# Patient Record
Sex: Male | Born: 1935 | Race: White | Hispanic: No | Smoking: Never smoker
Health system: Southern US, Community
[De-identification: ages and names within clinical notes are randomized; demographics above are authoritative.]

## PROBLEM LIST (undated history)

## (undated) DIAGNOSIS — K5790 Diverticulosis of intestine, part unspecified, without perforation or abscess without bleeding: Secondary | ICD-10-CM

## (undated) DIAGNOSIS — C61 Malignant neoplasm of prostate: Secondary | ICD-10-CM

## (undated) DIAGNOSIS — M1990 Unspecified osteoarthritis, unspecified site: Secondary | ICD-10-CM

## (undated) DIAGNOSIS — R0602 Shortness of breath: Secondary | ICD-10-CM

## (undated) DIAGNOSIS — E785 Hyperlipidemia, unspecified: Secondary | ICD-10-CM

## (undated) DIAGNOSIS — Z87440 Personal history of urinary (tract) infections: Secondary | ICD-10-CM

## (undated) HISTORY — DX: Unspecified osteoarthritis, unspecified site: M19.90

## (undated) HISTORY — PX: NECK SURGERY: SHX720

## (undated) HISTORY — PX: TONSILLECTOMY: SUR1361

## (undated) HISTORY — DX: Malignant neoplasm of prostate: C61

## (undated) HISTORY — PX: APPENDECTOMY: SHX54

## (undated) HISTORY — DX: Hyperlipidemia, unspecified: E78.5

## (undated) HISTORY — DX: Personal history of urinary (tract) infections: Z87.440

## (undated) HISTORY — DX: Diverticulosis of intestine, part unspecified, without perforation or abscess without bleeding: K57.90

## (undated) HISTORY — PX: PROSTATE SURGERY: SHX751

---

## 1997-09-10 ENCOUNTER — Ambulatory Visit (HOSPITAL_COMMUNITY): Admission: RE | Admit: 1997-09-10 | Discharge: 1997-09-10 | Payer: Self-pay | Admitting: Urology

## 2000-05-06 ENCOUNTER — Ambulatory Visit (HOSPITAL_COMMUNITY): Admission: EM | Admit: 2000-05-06 | Discharge: 2000-05-06 | Payer: Self-pay | Admitting: *Deleted

## 2008-05-01 ENCOUNTER — Ambulatory Visit: Payer: Self-pay | Admitting: Oncology

## 2008-05-30 LAB — COMPREHENSIVE METABOLIC PANEL
Albumin: 4.1 g/dL (ref 3.5–5.2)
Alkaline Phosphatase: 56 U/L (ref 39–117)
BUN: 26 mg/dL — ABNORMAL HIGH (ref 6–23)
CO2: 25 mEq/L (ref 19–32)
Calcium: 9 mg/dL (ref 8.4–10.5)
Glucose, Bld: 138 mg/dL — ABNORMAL HIGH (ref 70–99)
Potassium: 3.8 mEq/L (ref 3.5–5.3)

## 2008-05-30 LAB — TESTOSTERONE: Testosterone: 234.56 ng/dL — ABNORMAL LOW (ref 350–890)

## 2008-05-30 LAB — CBC WITH DIFFERENTIAL/PLATELET
Basophils Absolute: 0 10*3/uL (ref 0.0–0.1)
Eosinophils Absolute: 0.1 10*3/uL (ref 0.0–0.5)
HGB: 12.4 g/dL — ABNORMAL LOW (ref 13.0–17.1)
MCV: 90.9 fL (ref 79.3–98.0)
MONO%: 10.3 % (ref 0.0–14.0)
NEUT#: 3.2 10*3/uL (ref 1.5–6.5)
Platelets: 177 10*3/uL (ref 140–400)
RDW: 13.2 % (ref 11.0–14.6)

## 2008-05-30 LAB — PSA: PSA: 24.31 ng/mL — ABNORMAL HIGH (ref 0.10–4.00)

## 2008-06-04 ENCOUNTER — Ambulatory Visit: Admission: RE | Admit: 2008-06-04 | Discharge: 2008-08-21 | Payer: Self-pay | Admitting: Radiation Oncology

## 2008-07-05 ENCOUNTER — Ambulatory Visit (HOSPITAL_COMMUNITY): Admission: RE | Admit: 2008-07-05 | Discharge: 2008-07-05 | Payer: Self-pay | Admitting: Oncology

## 2008-07-10 ENCOUNTER — Ambulatory Visit: Payer: Self-pay | Admitting: Oncology

## 2008-07-10 LAB — COMPREHENSIVE METABOLIC PANEL
ALT: 19 U/L (ref 0–53)
Albumin: 3.8 g/dL (ref 3.5–5.2)
Alkaline Phosphatase: 54 U/L (ref 39–117)
CO2: 29 mEq/L (ref 19–32)
Glucose, Bld: 181 mg/dL — ABNORMAL HIGH (ref 70–99)
Potassium: 4.3 mEq/L (ref 3.5–5.3)
Sodium: 141 mEq/L (ref 135–145)
Total Bilirubin: 0.8 mg/dL (ref 0.3–1.2)
Total Protein: 7.4 g/dL (ref 6.0–8.3)

## 2008-07-10 LAB — CBC WITH DIFFERENTIAL/PLATELET
BASO%: 0.4 % (ref 0.0–2.0)
Eosinophils Absolute: 0.2 10*3/uL (ref 0.0–0.5)
LYMPH%: 19.3 % (ref 14.0–49.0)
MCHC: 34.8 g/dL (ref 32.0–36.0)
MONO#: 0.4 10*3/uL (ref 0.1–0.9)
MONO%: 8.5 % (ref 0.0–14.0)
NEUT#: 3.4 10*3/uL (ref 1.5–6.5)
RBC: 3.8 10*6/uL — ABNORMAL LOW (ref 4.20–5.82)
RDW: 13.6 % (ref 11.0–14.6)
WBC: 5.1 10*3/uL (ref 4.0–10.3)

## 2008-07-10 LAB — PSA: PSA: 17.41 ng/mL — ABNORMAL HIGH (ref 0.10–4.00)

## 2008-08-31 ENCOUNTER — Ambulatory Visit: Payer: Self-pay | Admitting: Oncology

## 2008-09-27 ENCOUNTER — Encounter (INDEPENDENT_AMBULATORY_CARE_PROVIDER_SITE_OTHER): Payer: Self-pay | Admitting: Urology

## 2008-09-27 ENCOUNTER — Ambulatory Visit (HOSPITAL_BASED_OUTPATIENT_CLINIC_OR_DEPARTMENT_OTHER): Admission: RE | Admit: 2008-09-27 | Discharge: 2008-09-28 | Payer: Self-pay | Admitting: Urology

## 2008-10-10 ENCOUNTER — Ambulatory Visit: Payer: Self-pay | Admitting: Oncology

## 2008-10-12 LAB — COMPREHENSIVE METABOLIC PANEL
Albumin: 3.9 g/dL (ref 3.5–5.2)
BUN: 27 mg/dL — ABNORMAL HIGH (ref 6–23)
CO2: 22 mEq/L (ref 19–32)
Calcium: 9.2 mg/dL (ref 8.4–10.5)
Chloride: 106 mEq/L (ref 96–112)
Glucose, Bld: 207 mg/dL — ABNORMAL HIGH (ref 70–99)
Potassium: 4.7 mEq/L (ref 3.5–5.3)
Sodium: 138 mEq/L (ref 135–145)
Total Protein: 7.4 g/dL (ref 6.0–8.3)

## 2008-10-12 LAB — CBC WITH DIFFERENTIAL/PLATELET
Basophils Absolute: 0 10*3/uL (ref 0.0–0.1)
Eosinophils Absolute: 0.3 10*3/uL (ref 0.0–0.5)
HGB: 10.9 g/dL — ABNORMAL LOW (ref 13.0–17.1)
MCV: 90.1 fL (ref 79.3–98.0)
MONO#: 0.5 10*3/uL (ref 0.1–0.9)
NEUT#: 5.2 10*3/uL (ref 1.5–6.5)
RBC: 3.48 10*6/uL — ABNORMAL LOW (ref 4.20–5.82)
RDW: 13.6 % (ref 11.0–14.6)
WBC: 7 10*3/uL (ref 4.0–10.3)
lymph#: 0.9 10*3/uL (ref 0.9–3.3)

## 2008-11-27 ENCOUNTER — Ambulatory Visit: Admission: RE | Admit: 2008-11-27 | Discharge: 2009-02-14 | Payer: Self-pay | Admitting: Radiation Oncology

## 2009-01-30 LAB — URINALYSIS, MICROSCOPIC - CHCC: Bilirubin (Urine): NEGATIVE

## 2009-03-01 ENCOUNTER — Ambulatory Visit: Admission: RE | Admit: 2009-03-01 | Discharge: 2009-03-01 | Payer: Self-pay | Admitting: Radiation Oncology

## 2009-03-01 LAB — URINALYSIS, MICROSCOPIC - CHCC
Bilirubin (Urine): NEGATIVE
Glucose: 1 g/dL
Ketones: NEGATIVE mg/dL
Specific Gravity, Urine: 1.025 (ref 1.003–1.035)

## 2009-03-19 ENCOUNTER — Ambulatory Visit: Admission: RE | Admit: 2009-03-19 | Discharge: 2009-03-19 | Payer: Self-pay | Admitting: Radiation Oncology

## 2009-03-19 LAB — URINALYSIS, MICROSCOPIC - CHCC
Bilirubin (Urine): NEGATIVE
Specific Gravity, Urine: 1.025 (ref 1.003–1.035)

## 2009-09-05 ENCOUNTER — Ambulatory Visit: Payer: Self-pay | Admitting: Oncology

## 2010-04-06 ENCOUNTER — Encounter: Payer: Self-pay | Admitting: Oncology

## 2010-04-07 ENCOUNTER — Encounter: Payer: Self-pay | Admitting: Oncology

## 2010-06-22 LAB — CBC
MCHC: 33.7 g/dL (ref 30.0–36.0)
MCV: 90.8 fL (ref 78.0–100.0)
Platelets: 181 10*3/uL (ref 150–400)
RDW: 13.1 % (ref 11.5–15.5)
WBC: 5.5 10*3/uL (ref 4.0–10.5)

## 2010-06-22 LAB — GLUCOSE, CAPILLARY
Glucose-Capillary: 160 mg/dL — ABNORMAL HIGH (ref 70–99)
Glucose-Capillary: 269 mg/dL — ABNORMAL HIGH (ref 70–99)

## 2010-06-22 LAB — BASIC METABOLIC PANEL
BUN: 25 mg/dL — ABNORMAL HIGH (ref 6–23)
Chloride: 104 mEq/L (ref 96–112)
Creatinine, Ser: 1.75 mg/dL — ABNORMAL HIGH (ref 0.4–1.5)
Glucose, Bld: 151 mg/dL — ABNORMAL HIGH (ref 70–99)

## 2010-07-29 NOTE — Op Note (Signed)
[name], Ian Patterson             ACCOUNT NO.:  [id number]   MEDICAL RECORD NO.:  [id number]          PATIENT TYPE:  AMB   LOCATION:  [facility]                         FACILITY:  [facility]   PHYSICIAN:  Heloise Purpura, MD      DATE OF BIRTH:  Mar 03, 1924   DATE OF PROCEDURE:  09/27/2008  DATE OF DISCHARGE:                               OPERATIVE REPORT   PREOPERATIVE DIAGNOSES:  1. Prostate cancer.  2. Urinary retention.   POSTOPERATIVE DIAGNOSES:  1. Prostate cancer.  2. Urinary retention.   PROCEDURE:  1. Cystoscopy.  2. Transurethral resection of the prostate.   SURGEON:  Heloise Purpura, M.D.   ASSISTANT:  Edward Qualia, M.D.   ANESTHESIA:  General.   COMPLICATIONS:  None.   ESTIMATED BLOOD LOSS:  Minimal.   SPECIMENS:  Prostate chips.   DISPOSITION:  Specimen to pathology.   INDICATIONS:  Mr. Law is an 77 year old gentleman who was recently  diagnosed with high risk clinically localized prostate cancer.  He has  been treated with androgen deprivation and is scheduled to undergo  treatment with external beam radiation therapy.  However, he was noted  to be in urinary retention with bilateral hydroureteronephrosis and  necessitated placement of a Foley catheter.  His hydronephrosis resolved  following placement of his catheter, and he did undergo multiple voiding  trials which were unsuccessful despite maximum alpha blockade.  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  The potential risks, complications, and alternative  treatment options were discussed in detail, and informed consent was  obtained.   DESCRIPTION OF PROCEDURE:  The patient was taken to the operating room,  and a general anesthetic was administered.  He was given preoperative  antibiotics, placed in the dorsal lithotomy position, and prepped and  draped in the usual sterile fashion.  Next, a  preoperative time out was  performed.  Of note, he was administered culture-specific antibiotics.  Cystourethroscopy was then performed which demonstrated a normal  anterior urethra.  Prostatic urethra was notable for very large lateral  lobes along with a medium lobe.  Inspection of the bladder revealed  moderate trabeculation without evidence for bladder tumors or stones.  The left ureteral orifice was easily identified.  The right ureteral  orifice was never completely visualized, but the prostate was noted to  be well away from the trigone of the bladder.  The 28-French  resectoscope sheath was then placed into the bladder, and utilizing the  bipolar resection loop, the median lobe was then resected from the  bladder neck back to the verumontanum.  The lateral lobes were then  systematically resected down to the prostatic capsule from the bladder  neck back to the level of the verumontanum.  Prostate chips were then  removed from the bladder.  Reexamination revealed no further prostate  chips within the bladder.  Hemostasis was achieved, and the scope was  removed.  Then, a 22-French 3-way Foley catheter was placed.  The  patient was placed under continuous bladder irrigation.  He  tolerated  the procedure well and without complications.      Heloise Purpura, MD  Electronically Signed     LB/MEDQ  D:  09/27/2008  T:  09/27/2008  Job:  [id number]

## 2010-10-06 ENCOUNTER — Emergency Department (HOSPITAL_COMMUNITY)
Admission: EM | Admit: 2010-10-06 | Discharge: 2010-10-06 | Disposition: A | Discharge status: Home / Self Care | Payer: Medicare Other | Attending: Emergency Medicine | Admitting: Emergency Medicine

## 2010-10-06 ENCOUNTER — Emergency Department (HOSPITAL_COMMUNITY): Payer: Medicare Other

## 2010-10-06 ENCOUNTER — Encounter (HOSPITAL_COMMUNITY): Payer: Self-pay | Admitting: Radiology

## 2010-10-06 DIAGNOSIS — R079 Chest pain, unspecified: Secondary | ICD-10-CM | POA: Insufficient documentation

## 2010-10-06 DIAGNOSIS — Z79899 Other long term (current) drug therapy: Secondary | ICD-10-CM | POA: Insufficient documentation

## 2010-10-06 DIAGNOSIS — J309 Allergic rhinitis, unspecified: Secondary | ICD-10-CM | POA: Insufficient documentation

## 2010-10-06 DIAGNOSIS — E119 Type 2 diabetes mellitus without complications: Secondary | ICD-10-CM | POA: Insufficient documentation

## 2010-10-06 DIAGNOSIS — R0609 Other forms of dyspnea: Secondary | ICD-10-CM | POA: Insufficient documentation

## 2010-10-06 DIAGNOSIS — E785 Hyperlipidemia, unspecified: Secondary | ICD-10-CM | POA: Insufficient documentation

## 2010-10-06 DIAGNOSIS — R0602 Shortness of breath: Secondary | ICD-10-CM | POA: Insufficient documentation

## 2010-10-06 DIAGNOSIS — R0989 Other specified symptoms and signs involving the circulatory and respiratory systems: Secondary | ICD-10-CM | POA: Insufficient documentation

## 2010-10-06 HISTORY — DX: Shortness of breath: R06.02

## 2010-10-06 LAB — DIFFERENTIAL
Eosinophils Relative: 2 % (ref 0–5)
Lymphocytes Relative: 13 % (ref 12–46)
Lymphs Abs: 0.8 10*3/uL (ref 0.7–4.0)
Monocytes Absolute: 0.6 10*3/uL (ref 0.1–1.0)
Monocytes Relative: 10 % (ref 3–12)

## 2010-10-06 LAB — BASIC METABOLIC PANEL
BUN: 19 mg/dL (ref 6–23)
CO2: 25 mEq/L (ref 19–32)
Calcium: 9.4 mg/dL (ref 8.4–10.5)
GFR calc non Af Amer: 45 mL/min — ABNORMAL LOW (ref >60)
Glucose, Bld: 180 mg/dL — ABNORMAL HIGH (ref 70–99)

## 2010-10-06 LAB — CBC
HCT: 34.1 % — ABNORMAL LOW (ref 39.0–52.0)
Hemoglobin: 11.5 g/dL — ABNORMAL LOW (ref 13.0–17.0)
MCH: 29.9 pg (ref 26.0–34.0)
MCHC: 33.7 g/dL (ref 30.0–36.0)
MCV: 88.8 fL (ref 78.0–100.0)
RDW: 13.4 % (ref 11.5–15.5)

## 2010-10-06 MED ORDER — TECHNETIUM TO 99M ALBUMIN AGGREGATED
4.7000 | Freq: Once | INTRAVENOUS | Status: AC | PRN
  Administered 2010-10-06: 4.7 via INTRAVENOUS

## 2010-10-06 MED ORDER — XENON XE 133 GAS
10.4000 | GAS_FOR_INHALATION | Freq: Once | RESPIRATORY_TRACT | Status: AC | PRN
  Administered 2010-10-06: 10.4 via RESPIRATORY_TRACT

## 2010-10-09 ENCOUNTER — Emergency Department (HOSPITAL_COMMUNITY)
Admission: EM | Admit: 2010-10-09 | Discharge: 2010-10-09 | Disposition: A | Discharge status: Home / Self Care | Payer: Medicare Other | Attending: Emergency Medicine | Admitting: Emergency Medicine

## 2010-10-09 ENCOUNTER — Emergency Department (HOSPITAL_COMMUNITY): Payer: Medicare Other

## 2010-10-09 DIAGNOSIS — R1310 Dysphagia, unspecified: Secondary | ICD-10-CM | POA: Insufficient documentation

## 2010-10-09 DIAGNOSIS — E119 Type 2 diabetes mellitus without complications: Secondary | ICD-10-CM | POA: Insufficient documentation

## 2010-10-09 DIAGNOSIS — R059 Cough, unspecified: Secondary | ICD-10-CM | POA: Insufficient documentation

## 2010-10-09 DIAGNOSIS — E785 Hyperlipidemia, unspecified: Secondary | ICD-10-CM | POA: Insufficient documentation

## 2010-10-09 DIAGNOSIS — R05 Cough: Secondary | ICD-10-CM | POA: Insufficient documentation

## 2010-10-09 LAB — CBC
HCT: 37 % — ABNORMAL LOW (ref 39.0–52.0)
Hemoglobin: 12.4 g/dL — ABNORMAL LOW (ref 13.0–17.0)
MCHC: 33.5 g/dL (ref 30.0–36.0)

## 2010-10-09 LAB — DIFFERENTIAL
Eosinophils Absolute: 0.1 10*3/uL (ref 0.0–0.7)
Eosinophils Relative: 2 % (ref 0–5)
Lymphocytes Relative: 9 % — ABNORMAL LOW (ref 12–46)
Lymphs Abs: 0.5 10*3/uL — ABNORMAL LOW (ref 0.7–4.0)
Monocytes Relative: 9 % (ref 3–12)
Neutrophils Relative %: 80 % — ABNORMAL HIGH (ref 43–77)

## 2010-10-09 LAB — POCT I-STAT, CHEM 8
BUN: 25 mg/dL — ABNORMAL HIGH (ref 6–23)
Chloride: 103 mEq/L (ref 96–112)
Creatinine, Ser: 1.6 mg/dL — ABNORMAL HIGH (ref 0.50–1.35)
Glucose, Bld: 186 mg/dL — ABNORMAL HIGH (ref 70–99)
Hemoglobin: 13.3 g/dL (ref 13.0–17.0)
Potassium: 4 mEq/L (ref 3.5–5.1)

## 2010-10-12 ENCOUNTER — Emergency Department (HOSPITAL_COMMUNITY)
Admission: EM | Admit: 2010-10-12 | Discharge: 2010-10-12 | Disposition: A | Discharge status: Home / Self Care | Payer: Medicare Other | Attending: Emergency Medicine | Admitting: Emergency Medicine

## 2010-10-12 DIAGNOSIS — E119 Type 2 diabetes mellitus without complications: Secondary | ICD-10-CM | POA: Insufficient documentation

## 2010-10-12 DIAGNOSIS — E785 Hyperlipidemia, unspecified: Secondary | ICD-10-CM | POA: Insufficient documentation

## 2010-10-12 DIAGNOSIS — R059 Cough, unspecified: Secondary | ICD-10-CM | POA: Insufficient documentation

## 2010-10-12 DIAGNOSIS — R05 Cough: Secondary | ICD-10-CM | POA: Insufficient documentation

## 2010-10-12 DIAGNOSIS — R1310 Dysphagia, unspecified: Secondary | ICD-10-CM | POA: Insufficient documentation

## 2010-10-12 DIAGNOSIS — J3489 Other specified disorders of nose and nasal sinuses: Secondary | ICD-10-CM | POA: Insufficient documentation

## 2010-10-22 ENCOUNTER — Encounter: Payer: Self-pay | Admitting: Gastroenterology

## 2010-10-22 ENCOUNTER — Ambulatory Visit (INDEPENDENT_AMBULATORY_CARE_PROVIDER_SITE_OTHER): Payer: Medicare Other | Admitting: Gastroenterology

## 2010-10-22 DIAGNOSIS — R05 Cough: Secondary | ICD-10-CM

## 2010-10-22 DIAGNOSIS — C61 Malignant neoplasm of prostate: Secondary | ICD-10-CM

## 2010-10-22 DIAGNOSIS — R7309 Other abnormal glucose: Secondary | ICD-10-CM

## 2010-10-22 DIAGNOSIS — K639 Disease of intestine, unspecified: Secondary | ICD-10-CM

## 2010-10-22 DIAGNOSIS — K921 Melena: Secondary | ICD-10-CM

## 2010-10-22 DIAGNOSIS — R1310 Dysphagia, unspecified: Secondary | ICD-10-CM | POA: Insufficient documentation

## 2010-10-22 DIAGNOSIS — K929 Disease of digestive system, unspecified: Secondary | ICD-10-CM

## 2010-10-22 DIAGNOSIS — K219 Gastro-esophageal reflux disease without esophagitis: Secondary | ICD-10-CM

## 2010-10-22 DIAGNOSIS — Y842 Radiological procedure and radiotherapy as the cause of abnormal reaction of the patient, or of later complication, without mention of misadventure at the time of the procedure: Secondary | ICD-10-CM

## 2010-10-22 DIAGNOSIS — R7303 Prediabetes: Secondary | ICD-10-CM | POA: Insufficient documentation

## 2010-10-22 MED ORDER — RABEPRAZOLE SODIUM 20 MG PO TBEC: 20.0000 mg | DELAYED_RELEASE_TABLET | Freq: Every day | ORAL | Status: DC

## 2010-10-22 NOTE — Progress Notes (Addendum)
Extremely pleasant 77 year old Caucasian male who receives his health care from the [facility] in [place]. He has what appears to be asthmatic bronchitis perhaps related to chronic GERD associated shortness of breath, dyspnea, hoarseness, coughing, and some dysphagia in the upper pharyngeal area mostly for thick liquids. Recently had a barium swallow which was relatively unremarkable. The patient relates that he had endoscopy and esophageal dilatation 10 years ago with good improvement. His acid reflux is fairly well controlled in terms of regurgitation and burning substernal chest pain on daily Prilosec and when necessary ranitidine.. He apparently has voluntarily lost 20 pounds of weight over the last year. Other problems have been recurrent disc problems in his neck with a cervical laminectomy some 30 years ago. Also has received radiation seed implants for prostate cancer, completed one year ago.  He denies abuse of alcohol or cigarettes or solid food dysphagia. He's recently been in the emergency room and has been treated with nasal inhalers and pulmonary inhalers for suspected allergy related bronchospasm and nasal drainage.Re View of his barium swallow showed tertiary contractions in the esophagus without any evidence of achalasia, and some esophageal reflux, but the barium pill passed easily. Recent EKG and chest x-ray are normal. The patient does describe periodic rectal bleeding, apparently had a negative colonoscopy 4 years ago. There is a vague history of possible adult onset diabetes. His medications are incomplete since he did not bring a list of these with him. He does take Prilosec, Flonase, Fish Oil, Glyburide, ranitidine, simvastatin Tussionex, and when necessary tramadol. He denies abuse of NSAIDs for a known history of peptic ulcer disease, gallbladder disease, pancreatitis or hepatitis. He has had previous appendectomy.  Review of systems: He complains of allergies, degenerative  arthritis, painless hematuria, chronic cough with thick sputum production, night sweats, shortness of breath with exertion, and recurrent sore throat. He has been on frequent antibiotics for prostatitis within the last 2 months. He is followed by urology.   The rest of his 10 system review of systems was negative.   Past Medical History  Diagnosis Date  . SOB (shortness of breath)   . Prostate cancer     hx of   . Arthritis   . Asthma   . Diabetes mellitus   . Diverticulosis   . Hyperlipemia   . Hx: UTI (urinary tract infection)    Past Surgical History  Procedure Date  . Appendectomy   . Tonsillectomy   . Prostate surgery   . Neck surgery     reports that he has never smoked. He has never used smokeless tobacco. He reports that he drinks alcohol. He reports that he does not use illicit drugs. family history is negative for Colon cancer. Allergies  Allergen Reactions  . Penicillins    Physical Exam: Awake and alert no acute distress appearing his stated age. I cannot appreciate stigmata of chronic liver disease, thyromegaly or lymphadenopathy. Examination of the neck shows no other abnormalities. Inspection of the oral pharyngeal area is unremarkable. Chest is clear without wheezes or rhonchi, and cardiac exam is unremarkable with a normal rhythm. There is no hepatosplenomegaly, abdominal masses or tenderness. Bowel sounds are normal. Exam is deferred. Peripheral extremities show trace edema but otherwise no evidence of swollen joints, edema, or phlebitis. Mental status is clear there are no focal neurological deficits.  Had the patient swallow water and there was no aspiration, choking, or other problems with this maneuver.  Assessment and plan: I suspect this patient has rather  severe acid reflux with associated extra esophageal manifestations of acid regurgitation. We of course did use quit esophageal carcinoma despite a negative barium swallow. I scheduled him for endoscopic exam  and possible dilation. If this is unremarkable we'll proceed with esophageal manometry. I placed him on standard antireflux maneuvers with AcipHex 20 mg twice a day. His symptomatology is consistent with mild radiation proctitis, and apparently he has had a recent colonoscopy which was unremarkable, and he is not interested in repeating this procedure.

## 2010-10-22 NOTE — Patient Instructions (Addendum)
You should bring a complete list of all of your medications with name, dose and instructions every visit.  Your procedure has been scheduled for 10/27/2010, please follow the seperate instructions.  Stop the prilosec and start the Aciphex 20mg  once a day about 30 min before breakfast. Samples given.

## 2010-10-27 ENCOUNTER — Encounter: Payer: Self-pay | Admitting: Gastroenterology

## 2010-10-27 ENCOUNTER — Ambulatory Visit (AMBULATORY_SURGERY_CENTER): Payer: Medicare Other | Admitting: Gastroenterology

## 2010-10-27 DIAGNOSIS — R1310 Dysphagia, unspecified: Secondary | ICD-10-CM

## 2010-10-27 DIAGNOSIS — R059 Cough, unspecified: Secondary | ICD-10-CM

## 2010-10-27 DIAGNOSIS — R05 Cough: Secondary | ICD-10-CM

## 2010-10-27 DIAGNOSIS — K219 Gastro-esophageal reflux disease without esophagitis: Secondary | ICD-10-CM

## 2010-10-27 DIAGNOSIS — K2270 Barrett's esophagus without dysplasia: Secondary | ICD-10-CM

## 2010-10-27 LAB — GLUCOSE, CAPILLARY

## 2010-10-27 MED ORDER — SODIUM CHLORIDE 0.9 % IV SOLN: 500.0000 mL | INTRAVENOUS | Status: AC

## 2010-10-27 NOTE — Progress Notes (Signed)
Patient states on Saturday, 10/25/10 he was unable to bear weight on right knee. Presently able to bear weight today with assistancehe of a walker. Patient stating he thinks he has Lyme Disease. Patient complains of inability to swallow water, yet able to swallow solid foods.

## 2010-10-27 NOTE — Patient Instructions (Addendum)
Please refer to blue and green discharge instruction sheet. Clear liquids for the first hour after you leave today, then soft foods the rest of the day.

## 2010-10-28 ENCOUNTER — Telehealth: Payer: Self-pay

## 2010-10-28 NOTE — Telephone Encounter (Signed)
No ID on answering machine. 

## 2010-10-29 ENCOUNTER — Telehealth: Payer: Self-pay | Admitting: Gastroenterology

## 2010-10-29 DIAGNOSIS — T17320A Food in larynx causing asphyxiation, initial encounter: Secondary | ICD-10-CM

## 2010-10-29 DIAGNOSIS — T17310A Gastric contents in larynx causing asphyxiation, initial encounter: Secondary | ICD-10-CM

## 2010-10-29 DIAGNOSIS — J9801 Acute bronchospasm: Secondary | ICD-10-CM

## 2010-10-29 NOTE — Telephone Encounter (Signed)
Pt reports he's still having trouble swallowing and he can't catch his breath. Pt had EGD with Dilation on 10/27/10. He thought the EGD would help, but it's the same as it was. He reports he has trouble swallowing his saliva and water, apparently he does ok on solid food. He reports his throat is sore, he's hoarse and he's coughing. Instructed pt to go to the ER or call 911 if he can't breathe or swallow; I will speak with Dr Jarold Motto in am and call him.                           (Pt states he has a dental appt at 2pm tomorrow) Please advise.Marland Kitchen

## 2010-10-30 ENCOUNTER — Encounter: Payer: Self-pay | Admitting: Internal Medicine

## 2010-10-30 NOTE — Telephone Encounter (Signed)
Notified pt his CT scan is 11/03/10 at [address] at 2:30pm. Pt stated understanding.

## 2010-10-30 NOTE — Telephone Encounter (Signed)
He needsa i care w/u////I suspect he has CV etiology of his problems and will need chest CT scan

## 2010-10-30 NOTE — Telephone Encounter (Addendum)
Pt reports he made it through the night, but his throat is still a little sore. Pt stated he is going to try to keep his dental appt today. Informed pt he needs to f/u with his PCP and he needs a chest CT for the ongoing problem. Pt reports  His PCP is at the [facility] in [place] and he doesn't know how long it will take to get a scan. DR Jarold Motto agreed to order the CT scan at [facility] for Monday, 11/03/10 at 2:30pm.

## 2010-11-03 ENCOUNTER — Ambulatory Visit (INDEPENDENT_AMBULATORY_CARE_PROVIDER_SITE_OTHER)
Admission: RE | Admit: 2010-11-03 | Discharge: 2010-11-03 | Disposition: A | Discharge status: Home / Self Care | Payer: Medicare Other | Source: Ambulatory Visit | Attending: Gastroenterology | Admitting: Gastroenterology

## 2010-11-03 DIAGNOSIS — T17308A Unspecified foreign body in larynx causing other injury, initial encounter: Secondary | ICD-10-CM

## 2010-11-03 DIAGNOSIS — T17320A Food in larynx causing asphyxiation, initial encounter: Secondary | ICD-10-CM

## 2010-11-03 DIAGNOSIS — T17310A Gastric contents in larynx causing asphyxiation, initial encounter: Secondary | ICD-10-CM

## 2010-11-03 DIAGNOSIS — R1310 Dysphagia, unspecified: Secondary | ICD-10-CM

## 2010-11-03 DIAGNOSIS — J9801 Acute bronchospasm: Secondary | ICD-10-CM

## 2010-11-04 ENCOUNTER — Telehealth: Payer: Self-pay | Admitting: *Deleted

## 2010-11-04 MED ORDER — RABEPRAZOLE SODIUM 20 MG PO TBEC: 20.0000 mg | DELAYED_RELEASE_TABLET | Freq: Two times a day (BID) | ORAL | Status: DC

## 2010-11-04 NOTE — Telephone Encounter (Signed)
Message copied by Florene Glen on Tue Nov 04, 2010  9:40 AM ------      Message from: Jarold Motto, DAVID R      Created: Tue Nov 04, 2010  9:22 AM       Continue bid PPI Rx... CT scan of the chest is unremarkable. I would recommend to him that he schedule followup at the [facility] for his ongoing care.

## 2010-11-04 NOTE — Telephone Encounter (Signed)
Notified pt his CT scan is unremarkable and to continue Aciphex BID per Dr Jarold Motto. He is advised to f/u at the [facility]. Pt to pick up a script for the Aciphex.

## 2010-11-05 ENCOUNTER — Encounter: Payer: Self-pay | Admitting: Gastroenterology

## 2010-12-06 ENCOUNTER — Emergency Department (HOSPITAL_COMMUNITY)
Admission: EM | Admit: 2010-12-06 | Discharge: 2010-12-06 | Disposition: A | Discharge status: Home / Self Care | Payer: Medicare Other | Attending: Emergency Medicine | Admitting: Emergency Medicine

## 2010-12-06 DIAGNOSIS — R5381 Other malaise: Secondary | ICD-10-CM | POA: Insufficient documentation

## 2010-12-06 DIAGNOSIS — E119 Type 2 diabetes mellitus without complications: Secondary | ICD-10-CM | POA: Insufficient documentation

## 2010-12-06 DIAGNOSIS — I10 Essential (primary) hypertension: Secondary | ICD-10-CM | POA: Insufficient documentation

## 2010-12-06 DIAGNOSIS — R1310 Dysphagia, unspecified: Secondary | ICD-10-CM | POA: Insufficient documentation

## 2010-12-08 LAB — GLUCOSE, CAPILLARY: Glucose-Capillary: 110 mg/dL — ABNORMAL HIGH (ref 70–99)

## 2011-10-22 ENCOUNTER — Emergency Department (HOSPITAL_COMMUNITY)
Admission: EM | Admit: 2011-10-22 | Discharge: 2011-10-22 | Disposition: A | Discharge status: Home / Self Care | Payer: Medicare Other | Attending: Emergency Medicine | Admitting: Emergency Medicine

## 2011-10-22 ENCOUNTER — Encounter (HOSPITAL_COMMUNITY): Payer: Self-pay | Admitting: Emergency Medicine

## 2011-10-22 DIAGNOSIS — Z87440 Personal history of urinary (tract) infections: Secondary | ICD-10-CM | POA: Insufficient documentation

## 2011-10-22 DIAGNOSIS — Z7982 Long term (current) use of aspirin: Secondary | ICD-10-CM | POA: Insufficient documentation

## 2011-10-22 DIAGNOSIS — E119 Type 2 diabetes mellitus without complications: Secondary | ICD-10-CM | POA: Insufficient documentation

## 2011-10-22 DIAGNOSIS — E785 Hyperlipidemia, unspecified: Secondary | ICD-10-CM | POA: Insufficient documentation

## 2011-10-22 DIAGNOSIS — N390 Urinary tract infection, site not specified: Secondary | ICD-10-CM

## 2011-10-22 DIAGNOSIS — R339 Retention of urine, unspecified: Secondary | ICD-10-CM | POA: Insufficient documentation

## 2011-10-22 DIAGNOSIS — Z79899 Other long term (current) drug therapy: Secondary | ICD-10-CM | POA: Insufficient documentation

## 2011-10-22 DIAGNOSIS — H409 Unspecified glaucoma: Secondary | ICD-10-CM | POA: Insufficient documentation

## 2011-10-22 DIAGNOSIS — Z8546 Personal history of malignant neoplasm of prostate: Secondary | ICD-10-CM | POA: Insufficient documentation

## 2011-10-22 DIAGNOSIS — Z880 Allergy status to penicillin: Secondary | ICD-10-CM | POA: Insufficient documentation

## 2011-10-22 DIAGNOSIS — J45909 Unspecified asthma, uncomplicated: Secondary | ICD-10-CM | POA: Insufficient documentation

## 2011-10-22 LAB — URINALYSIS, ROUTINE W REFLEX MICROSCOPIC
Bilirubin Urine: NEGATIVE
Ketones, ur: NEGATIVE mg/dL
Nitrite: POSITIVE — AB
Protein, ur: NEGATIVE mg/dL
Urobilinogen, UA: 0.2 mg/dL (ref 0.0–1.0)

## 2011-10-22 LAB — URINE MICROSCOPIC-ADD ON

## 2011-10-22 MED ORDER — CIPROFLOXACIN HCL 500 MG PO TABS: 500.0000 mg | ORAL_TABLET | Freq: Two times a day (BID) | ORAL | Status: DC

## 2011-10-22 MED ORDER — SULFAMETHOXAZOLE-TRIMETHOPRIM 800-160 MG PO TABS: 1.0000 | ORAL_TABLET | Freq: Two times a day (BID) | ORAL | Status: DC

## 2011-10-22 NOTE — ED Notes (Signed)
Pt alert, arrives from home, c/o urinary retention, onset a few days ago, states increased frequency, burning with urination, this evening began retention, resp even unlabored, skin pwd

## 2011-10-22 NOTE — ED Provider Notes (Signed)
History     CSN: [id number]  Arrival date & time 10/22/11  8295   First MD Initiated Contact with Patient 10/22/11 [phone]      Chief Complaint  Patient presents with  . Urinary Retention    (Consider location/radiation/quality/duration/timing/severity/associated sxs/prior treatment) HPI Comments: Patient presents with an inability to void.  Was recently seen by the [facility] urologist in [place] and was told he had a stricture.  He denies fevers or chills.  No n/v/d.  Worse with attempting to urinate.  No alleviating factors.  The history is provided by the patient.    Past Medical History  Diagnosis Date  . SOB (shortness of breath)   . Prostate cancer     hx of   . Arthritis   . Asthma   . Diabetes mellitus   . Diverticulosis   . Hyperlipemia   . Hx: UTI (urinary tract infection)   . Glaucoma     Past Surgical History  Procedure Date  . Appendectomy   . Tonsillectomy   . Prostate surgery   . Neck surgery     Family History  Problem Relation Age of Onset  . Colon cancer Neg Hx     History  Substance Use Topics  . Smoking status: Never Smoker   . Smokeless tobacco: Never Used  . Alcohol Use: Yes     one daily       Review of Systems  All other systems reviewed and are negative.    Allergies  Penicillins  Home Medications   Current Outpatient Rx  Name Route Sig Dispense Refill  . ASPIRIN 325 MG PO TABS Oral Take 325 mg by mouth daily.      Marland Kitchen BRIMONIDINE TARTRATE 0.1 % OP SOLN  2 times daily at 12 noon and 4 pm.      . CIPROFLOXACIN HCL 500 MG PO TABS Oral Take 250 mg by mouth 2 (two) times daily.      . DUTASTERIDE 0.5 MG PO CAPS Oral Take 0.5 mg by mouth daily.      . GLYBURIDE 5 MG PO TABS Oral Take 5 mg by mouth daily with breakfast.      . FISH OIL 1000 MG PO CAPS Oral Take by mouth. Takes medication when he remembers     . OMEPRAZOLE 20 MG PO CPDR Oral Take 20 mg by mouth daily.      Marland Kitchen RABEPRAZOLE SODIUM 20 MG PO TBEC Oral Take 1 tablet (20 mg  total) by mouth 2 (two) times daily. 60 tablet 6  . RANITIDINE HCL 150 MG PO CAPS Oral Take 150 mg by mouth daily.      Marland Kitchen SIMVASTATIN 40 MG PO TABS Oral Take 40 mg by mouth at bedtime.      . TERAZOSIN HCL 5 MG PO CAPS Oral Take 5 mg by mouth at bedtime.      . TRAMADOL HCL 50 MG PO TABS Oral Take 50 mg by mouth every 8 (eight) hours as needed.      . TRAVOPROST 0.004 % OP SOLN  1 drop at bedtime.        BP 164/85  Pulse 87  Temp 98 F (36.7 C) (Oral)  Resp 16  SpO2 99%  Physical Exam  Nursing note and vitals reviewed. Constitutional: He is oriented to person, place, and time. He appears well-developed and well-nourished. No distress.  HENT:  Head: Normocephalic and atraumatic.  Neck: Normal range of motion. Neck supple.  Cardiovascular: Normal rate  and regular rhythm.   No murmur heard. Pulmonary/Chest: Effort normal and breath sounds normal. No respiratory distress.  Abdominal: Soft. Bowel sounds are normal. He exhibits no distension. There is no tenderness.  Musculoskeletal: Normal range of motion. He exhibits no edema.  Neurological: He is alert and oriented to person, place, and time.  Skin: Skin is warm and dry. He is not diaphoretic.    ED Course  Procedures (including critical care time)   Labs Reviewed  URINALYSIS, ROUTINE W REFLEX MICROSCOPIC   No results found.   No diagnosis found.    MDM  Foley cath inserted, but only 50 cc's obtained.  UA strongly suggestive of uti.  Will treat with cipro, leave foley in place until follow up with urology at [facility] in [place].        Geoffery Lyons, MD 10/22/11 [phone]

## 2011-10-25 ENCOUNTER — Emergency Department (HOSPITAL_COMMUNITY)
Admission: EM | Admit: 2011-10-25 | Discharge: 2011-10-25 | Disposition: A | Discharge status: Home / Self Care | Payer: Medicare Other | Attending: Emergency Medicine | Admitting: Emergency Medicine

## 2011-10-25 ENCOUNTER — Encounter (HOSPITAL_COMMUNITY): Payer: Self-pay | Admitting: Emergency Medicine

## 2011-10-25 DIAGNOSIS — E785 Hyperlipidemia, unspecified: Secondary | ICD-10-CM | POA: Insufficient documentation

## 2011-10-25 DIAGNOSIS — E119 Type 2 diabetes mellitus without complications: Secondary | ICD-10-CM | POA: Insufficient documentation

## 2011-10-25 DIAGNOSIS — Z8546 Personal history of malignant neoplasm of prostate: Secondary | ICD-10-CM | POA: Insufficient documentation

## 2011-10-25 DIAGNOSIS — R339 Retention of urine, unspecified: Secondary | ICD-10-CM | POA: Insufficient documentation

## 2011-10-25 DIAGNOSIS — M129 Arthropathy, unspecified: Secondary | ICD-10-CM | POA: Insufficient documentation

## 2011-10-25 DIAGNOSIS — R319 Hematuria, unspecified: Secondary | ICD-10-CM

## 2011-10-25 LAB — URINE CULTURE: Colony Count: >=100000

## 2011-10-25 MED ORDER — SULFAMETHOXAZOLE-TRIMETHOPRIM 800-160 MG PO TABS: 1.0000 | ORAL_TABLET | Freq: Two times a day (BID) | ORAL | Status: DC

## 2011-10-25 NOTE — ED Provider Notes (Signed)
History     CSN: [id number]  Arrival date & time 10/25/11  0702   First MD Initiated Contact with Patient 10/25/11 [phone]      Chief Complaint  Patient presents with  . Foley     (Consider location/radiation/quality/duration/timing/severity/associated sxs/prior treatment) The history is provided by the patient.  pt states has hx prostate ca, radiation txs/surgery in remote past, developed urine retention 3 days ago, was seen in ed, had foley, and placed on abx. Foley has been draining fine/well until last night when noted some blood in urine, then states urine quit flowing into tube but will come out around foley when tries to void. Constant. Denies abd pain or distension. No fever or chills. No anticoagulant use. No other abn bleeding or bruising. No faintness or dizziness. No nv. States otherwise feels well. States [facility] plan [facility]   Past Medical History  Diagnosis Date  . SOB (shortness of breath)   . Prostate cancer     hx of   . Arthritis   . Asthma   . Diabetes mellitus   . Diverticulosis   . Hyperlipemia   . Hx: UTI (urinary tract infection)   . Glaucoma     Past Surgical History  Procedure Date  . Appendectomy   . Tonsillectomy   . Prostate surgery   . Neck surgery     Family History  Problem Relation Age of Onset  . Colon cancer Neg Hx     History  Substance Use Topics  . Smoking status: Never Smoker   . Smokeless tobacco: Never Used  . Alcohol Use: Yes     one daily       Review of Systems  Constitutional: Negative for fever and chills.  Respiratory: Negative for shortness of breath.   Gastrointestinal: Negative for nausea, vomiting and abdominal pain.  Genitourinary: Negative for dysuria and flank pain.  Musculoskeletal: Negative for back pain.  Skin: Negative for rash.  Neurological: Negative for headaches.    Allergies  Penicillins  Home Medications   Current Outpatient Rx  Name Route Sig Dispense Refill  . ASPIRIN  325 MG PO TABS Oral Take 325 mg by mouth daily.      Marland Kitchen BRIMONIDINE TARTRATE 0.1 % OP SOLN  2 times daily at 12 noon and 4 pm.      . CIPROFLOXACIN HCL 500 MG PO TABS Oral Take 250 mg by mouth 2 (two) times daily.      . DUTASTERIDE 0.5 MG PO CAPS Oral Take 0.5 mg by mouth daily.      . GLYBURIDE 5 MG PO TABS Oral Take 5 mg by mouth daily with breakfast.      . FISH OIL 1000 MG PO CAPS Oral Take by mouth. Takes medication when he remembers     . RABEPRAZOLE SODIUM 20 MG PO TBEC Oral Take 1 tablet (20 mg total) by mouth 2 (two) times daily. 60 tablet 6  . SULFAMETHOXAZOLE-TRIMETHOPRIM 800-160 MG PO TABS Oral Take 1 tablet by mouth 2 (two) times daily. 14 tablet 0  . TERAZOSIN HCL 5 MG PO CAPS Oral Take 5 mg by mouth at bedtime.      . TRAMADOL HCL 50 MG PO TABS Oral Take 50 mg by mouth every 8 (eight) hours as needed. Pain    . TRAVOPROST 0.004 % OP SOLN Both Eyes Place 1 drop into both eyes at bedtime.       BP 187/70  Pulse 78  Temp 98 F (36.7 C) (Oral)  Resp 16  SpO2 99%  Physical Exam  Nursing note and vitals reviewed. Constitutional: He is oriented to person, place, and time. He appears well-developed and well-nourished. No distress.  HENT:  Head: Atraumatic.  Eyes: Pupils are equal, round, and reactive to light.  Neck: Neck supple. No tracheal deviation present.  Cardiovascular: Normal rate.   Pulmonary/Chest: Effort normal. No accessory muscle usage. No respiratory distress.  Abdominal: Soft. Bowel sounds are normal. He exhibits no distension. There is no tenderness.  Genitourinary:       Normal ext genitalia, foley catheter in place. No cva tenderness.  Musculoskeletal: Normal range of motion.  Neurological: He is alert and oriented to person, place, and time.  Skin: Skin is warm and dry.  Psychiatric: He has a normal mood and affect.    ED Course  Procedures (including critical care time)     MDM  Irrigate foley.  Reviewed recent urine culture. Coag neg staph,  not sens to quinolone on cx. Confirmed w pt only allergy is possibly to pcns.  Will give rx bactrim.   pts foley balloon broke, and catheter slipped out while pts cath being irrigated by staff.   Pt subsequent able to void on own, 250 cc.   Discussed that given probable uti, hematuria, that likely will have recurrent problems w urine retention.  Pt refuses foley catheter, requesting d/c home with no catheter, he will return if symptoms recur. abd soft nt.   [facility]        Suzi Roots, MD 10/25/11 1018

## 2011-10-25 NOTE — ED Notes (Signed)
Foley catheter irrigated with of sterile water and heard a "pop". Patient stated, "did you hear that, and then proceeded to remove the catheter. The balloon at the end of the catheter had burst. Patient then voided of bloody urine on his own. No blood clots noticed. Will notify MD.

## 2011-10-25 NOTE — ED Notes (Signed)
Patient voided of bloody dark urine. Urine is clear and no odor.

## 2011-10-25 NOTE — ED Notes (Signed)
Pt alert, arrives from home, c/o foley catheter clogged, onset was this am, does not have f/u for urology, resp even unlabored, skin pwd

## 2011-10-26 ENCOUNTER — Encounter (HOSPITAL_COMMUNITY): Payer: Self-pay | Admitting: *Deleted

## 2011-10-26 ENCOUNTER — Emergency Department (HOSPITAL_COMMUNITY)
Admission: EM | Admit: 2011-10-26 | Discharge: 2011-10-26 | Disposition: A | Discharge status: Home / Self Care | Payer: Medicare Other | Attending: Emergency Medicine | Admitting: Emergency Medicine

## 2011-10-26 DIAGNOSIS — R339 Retention of urine, unspecified: Secondary | ICD-10-CM | POA: Insufficient documentation

## 2011-10-26 DIAGNOSIS — E119 Type 2 diabetes mellitus without complications: Secondary | ICD-10-CM | POA: Insufficient documentation

## 2011-10-26 DIAGNOSIS — N390 Urinary tract infection, site not specified: Secondary | ICD-10-CM | POA: Insufficient documentation

## 2011-10-26 DIAGNOSIS — Z8546 Personal history of malignant neoplasm of prostate: Secondary | ICD-10-CM | POA: Insufficient documentation

## 2011-10-26 DIAGNOSIS — M129 Arthropathy, unspecified: Secondary | ICD-10-CM | POA: Insufficient documentation

## 2011-10-26 DIAGNOSIS — E785 Hyperlipidemia, unspecified: Secondary | ICD-10-CM | POA: Insufficient documentation

## 2011-10-26 LAB — URINALYSIS, MICROSCOPIC ONLY
Bilirubin Urine: NEGATIVE
Ketones, ur: NEGATIVE mg/dL
Specific Gravity, Urine: 1.015 (ref 1.005–1.030)
Urobilinogen, UA: 0.2 mg/dL (ref 0.0–1.0)

## 2011-10-26 NOTE — ED Provider Notes (Addendum)
History     CSN: [id number]  Arrival date & time 10/26/11  4098   First MD Initiated Contact with Patient 10/26/11 [phone]      Chief Complaint  Patient presents with  . Urinary Retention    (Consider location/radiation/quality/duration/timing/severity/associated sxs/prior treatment) HPI Comments: Has been unable to completely void since cath removed yesterday around 9am.  Patient is a 78 y.o. male presenting with frequency. The history is provided by the patient.  Urinary Frequency This is a recurrent problem. The current episode started 12 to 24 hours ago. The problem occurs constantly. The problem has been gradually worsening. Associated symptoms include abdominal pain. Exacerbated by: unable to urinate except for a few drops. Nothing relieves the symptoms.    Past Medical History  Diagnosis Date  . SOB (shortness of breath)   . Prostate cancer     hx of   . Arthritis   . Asthma   . Diabetes mellitus   . Diverticulosis   . Hyperlipemia   . Hx: UTI (urinary tract infection)   . Glaucoma     Past Surgical History  Procedure Date  . Appendectomy   . Tonsillectomy   . Prostate surgery   . Neck surgery     Family History  Problem Relation Age of Onset  . Colon cancer Neg Hx     History  Substance Use Topics  . Smoking status: Never Smoker   . Smokeless tobacco: Never Used  . Alcohol Use: Yes     one daily       Review of Systems  Constitutional: Negative for fever.  Gastrointestinal: Positive for abdominal pain.  Genitourinary: Positive for frequency.  All other systems reviewed and are negative.    Allergies  Penicillins  Home Medications   Current Outpatient Rx  Name Route Sig Dispense Refill  . ASPIRIN 325 MG PO TABS Oral Take 325 mg by mouth daily.      Marland Kitchen CIPROFLOXACIN HCL 500 MG PO TABS Oral Take 250 mg by mouth 2 (two) times daily. Taking until procedure    . DUTASTERIDE 0.5 MG PO CAPS Oral Take 0.5 mg by mouth daily.     Marland Kitchen GLIPIZIDE 5 MG  PO TABS Oral Take 5 mg by mouth daily.    Marland Kitchen LATANOPROST 0.005 % OP SOLN Both Eyes Place 1 drop into both eyes at bedtime.    Marland Kitchen FISH OIL 1000 MG PO CAPS Oral Take 1 capsule by mouth daily.     Marland Kitchen PANTOPRAZOLE SODIUM 40 MG PO TBEC Oral Take 40 mg by mouth daily.    Marland Kitchen SIMVASTATIN 40 MG PO TABS Oral Take 40 mg by mouth every evening.    . SULFAMETHOXAZOLE-TRIMETHOPRIM 800-160 MG PO TABS Oral Take 1 tablet by mouth 2 (two) times daily. For 10 days.    . TERAZOSIN HCL 5 MG PO CAPS Oral Take 5 mg by mouth at bedtime.     . TRAMADOL HCL 50 MG PO TABS Oral Take 50 mg by mouth every 8 (eight) hours as needed. Pain    . TRAVOPROST 0.004 % OP SOLN Both Eyes Place 1 drop into both eyes at bedtime.       BP 119/65  Pulse 97  Temp 99 F (37.2 C) (Oral)  Resp 20  SpO2 98%  Physical Exam  Nursing note and vitals reviewed. Constitutional: He is oriented to person, place, and time. He appears well-developed and well-nourished. No distress.  HENT:  Head: Normocephalic and atraumatic.  Mouth/Throat: Oropharynx  is clear and moist.  Eyes: Conjunctivae and EOM are normal. Pupils are equal, round, and reactive to light.  Neck: Normal range of motion. Neck supple.  Cardiovascular: Normal rate, regular rhythm and intact distal pulses.   No murmur heard. Pulmonary/Chest: Effort normal and breath sounds normal. No respiratory distress. He has no wheezes. He has no rales.  Abdominal: Soft. Normal appearance. He exhibits no distension. There is tenderness in the suprapubic area. There is no rebound and no guarding.  Musculoskeletal: Normal range of motion. He exhibits no edema and no tenderness.  Neurological: He is alert and oriented to person, place, and time.  Skin: Skin is warm and dry. No rash noted. No erythema.  Psychiatric: He has a normal mood and affect. His behavior is normal.    ED Course  Procedures (including critical care time)  Labs Reviewed  URINALYSIS, WITH MICROSCOPIC - Abnormal; Notable  for the following:    Color, Urine AMBER (*)  BIOCHEMICALS MAY BE AFFECTED BY COLOR   APPearance TURBID (*)     Glucose, UA 250 (*)     Hgb urine dipstick LARGE (*)     Protein, ur 100 (*)     Nitrite POSITIVE (*)     Leukocytes, UA LARGE (*)     Bacteria, UA MANY (*)     All other components within normal limits   No results found.   1. UTI (lower urinary tract infection)   2. Urinary retention       MDM   Patient with a history of urinary retention who is seen yesterday when his Foley clogged. Patient's Foley was removed yesterday and urine cultures repeated at that time found that he was not sensitive to Cipro and he was changed to Bactrim. However patient returns today because he is unable to urinate. He denies any other symptoms except of urinary retention. Foley placed.   UA here shows persistent large infection which is most likely the cause of his urinary retention. After Foley placed 800 cc of urine drained. Patient just started Bactrim yesterday and will have him continue a new culture done. Patient will followup with his urologist at the [facility] or see his Dr. at [facility] for ongoing treatment.     Gwyneth Sprout, MD 10/26/11 1610  Gwyneth Sprout, MD 10/26/11 9604  Gwyneth Sprout, MD 10/26/11 5409

## 2011-10-26 NOTE — ED Notes (Signed)
Pt states urinary retention since removing foley cath yesterday morning.

## 2011-10-26 NOTE — ED Notes (Signed)
+   urine  Patient treated appropriately -sensitive to same-chart appended per protocol MD.  

## 2011-10-26 NOTE — ED Notes (Signed)
Pt presenting to ed with c/o had catheter placed x 4 days ago he came in yesterday and it was removed. Pt states he has only dribbled urine since catheter removal yesterday. Pt states I'm not sure why they took it out yesterday.

## 2011-10-29 LAB — URINE CULTURE: Colony Count: >=100000

## 2011-10-31 NOTE — ED Notes (Signed)
Chart returned from EDP office . Patient on Bactrim already ,likely adequate coverage. May want to fax culture report to patient's PCP per Grant Fontana.

## 2011-11-02 NOTE — ED Notes (Signed)
Copy of labs faxed to Dr Diona Foley.

## 2011-12-22 IMAGING — CT CT CHEST W/O CM
2 of 3 series · 15 of 36 positions shown, 18 images · IV contrast (Omnipaque 300)
Comparison: None

CLINICAL DATA: Dysphagia with choking on food.  Bronchospasm.
Shortness of breath.

CT CHEST WITHOUT CONTRAST
TECHNIQUE: Multidetector CT imaging of the chest was performed
following the standard protocol without IV contrast.

[Series 2: chest routine with · axial · 0.78mm/px · z∈[-65,+210]mm · 12 of 65 slices shown, 15 images]
[im 5/65  mediastinal]
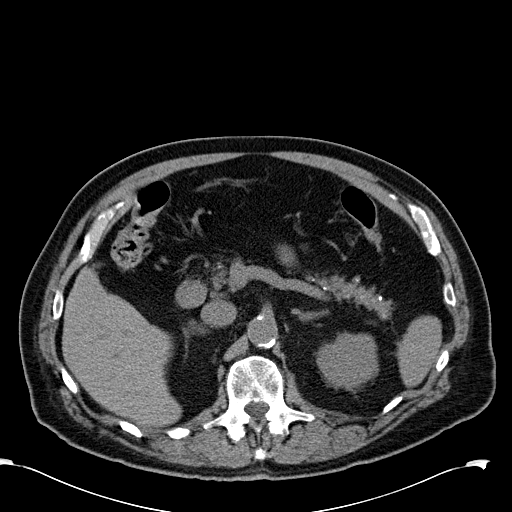
[im 5/65  lung]
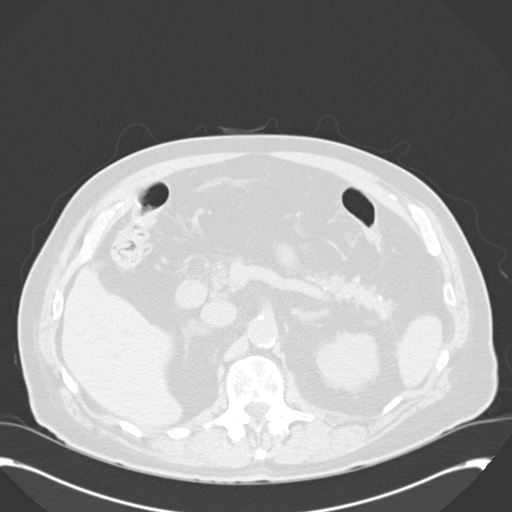
[im 10/65  lung]
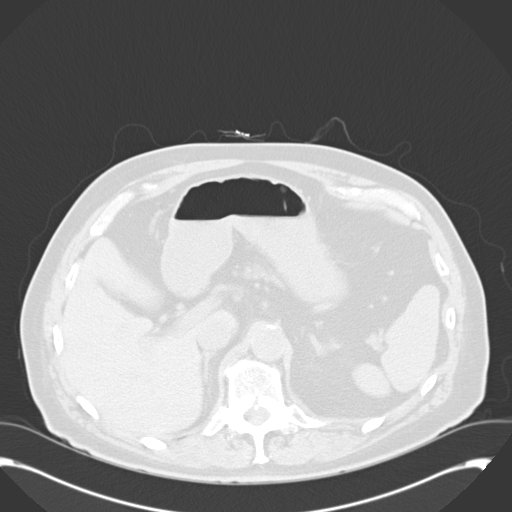
[im 15/65  lung]
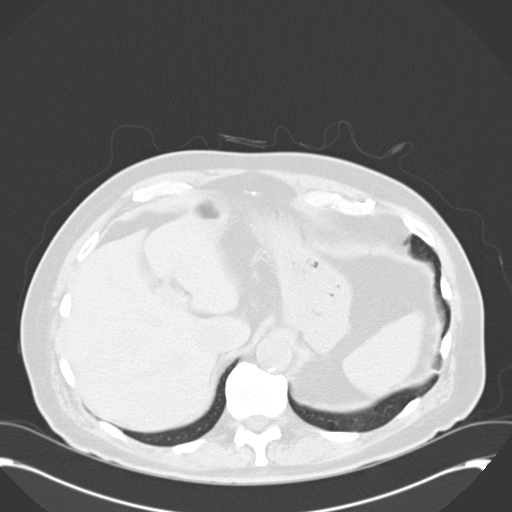
[im 19/65  lung]
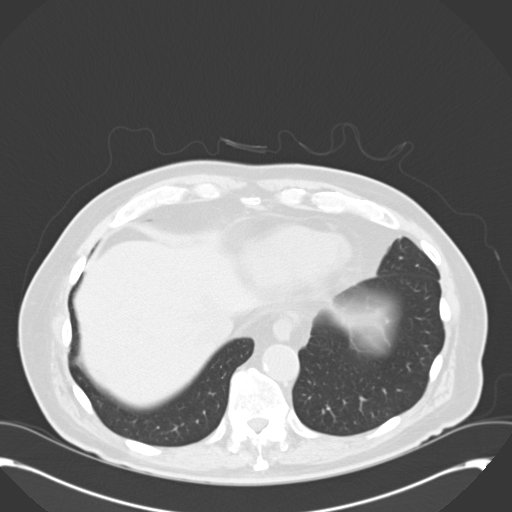
[im 24/65  mediastinal]
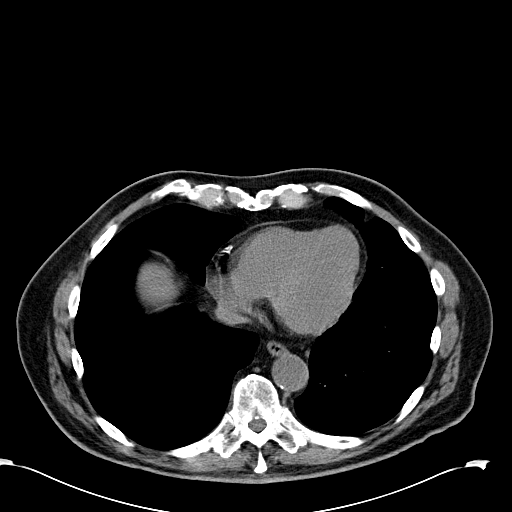
[im 24/65  lung]
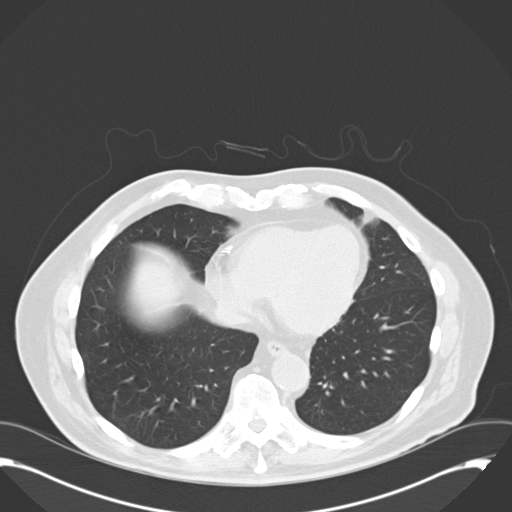
[im 29/65  lung]
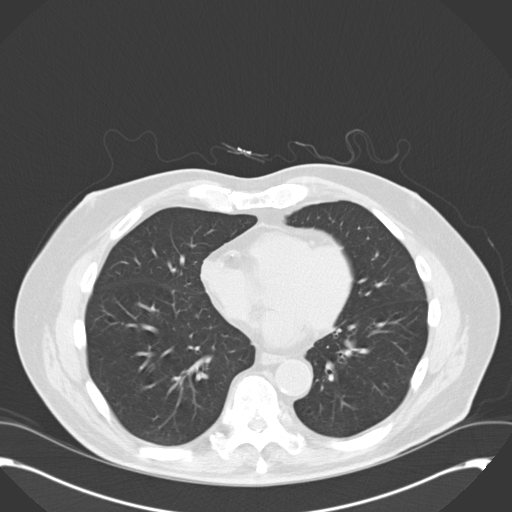
[im 36/65  lung]
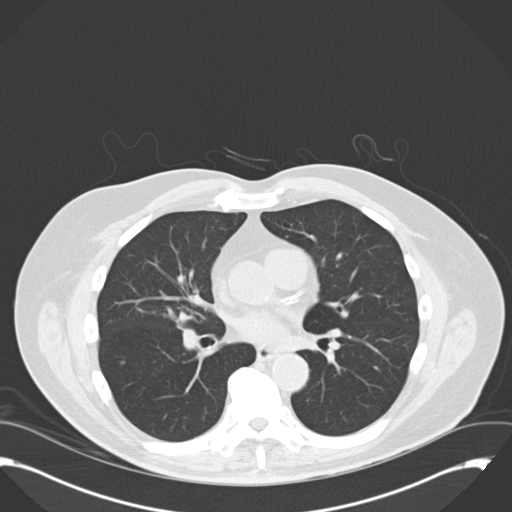
[im 41/65  lung]
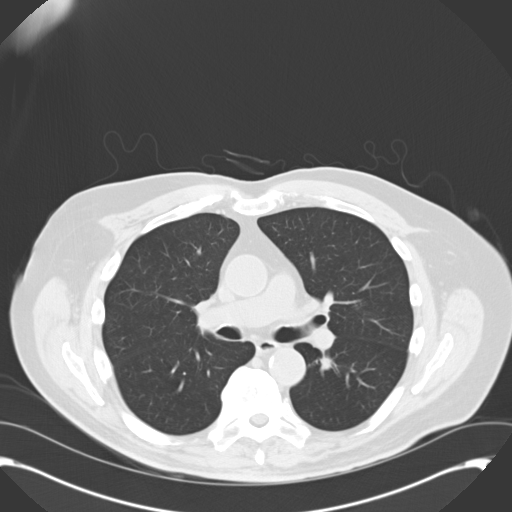
[im 46/65  mediastinal]
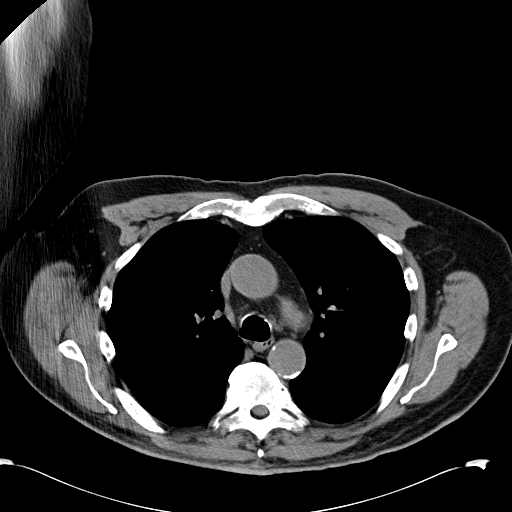
[im 46/65  lung]
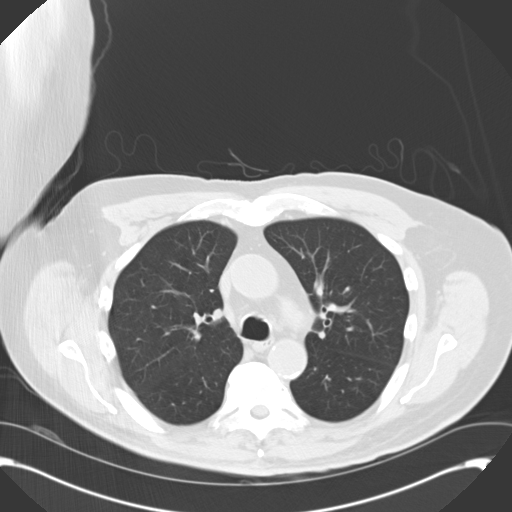
[im 50/65  lung]
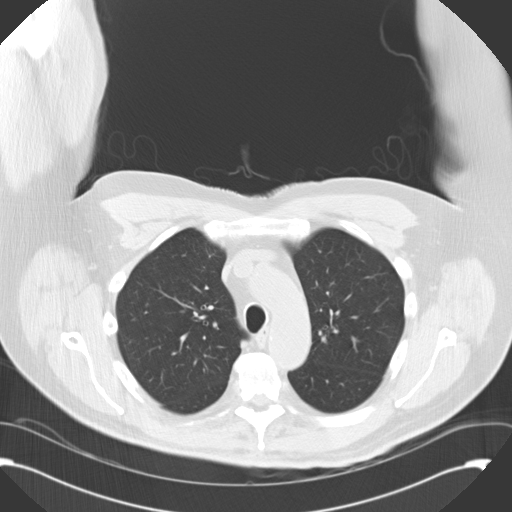
[im 55/65  lung]
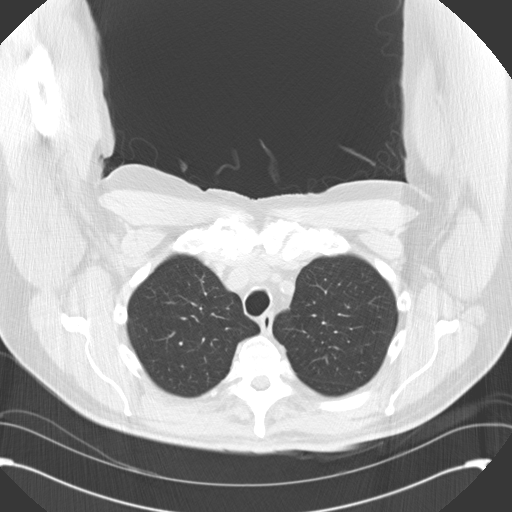
[im 60/65  lung]
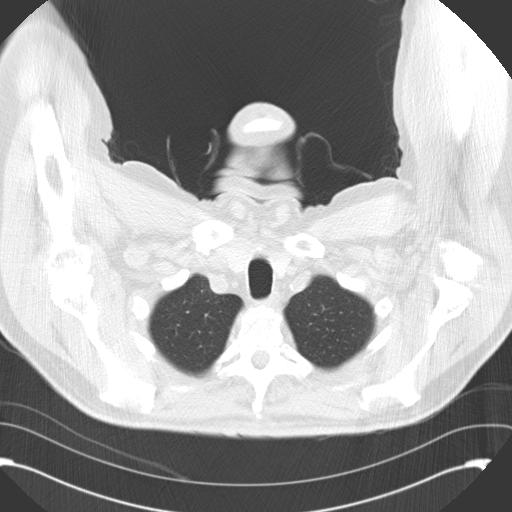

[Series 602: cor · coronal · 0.78mm/px · 3 of 125 slices shown]
[im 25/125  lung]
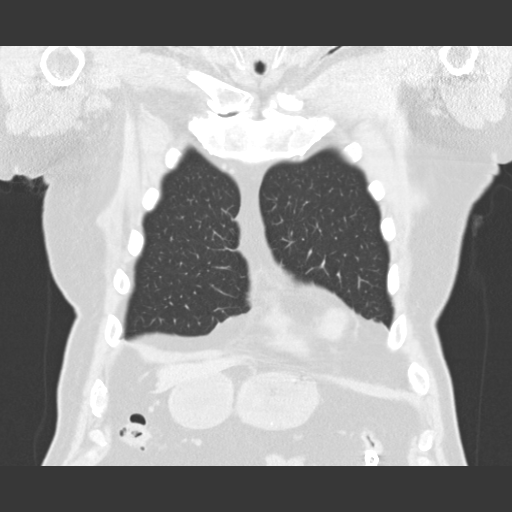
[im 50/125  lung]
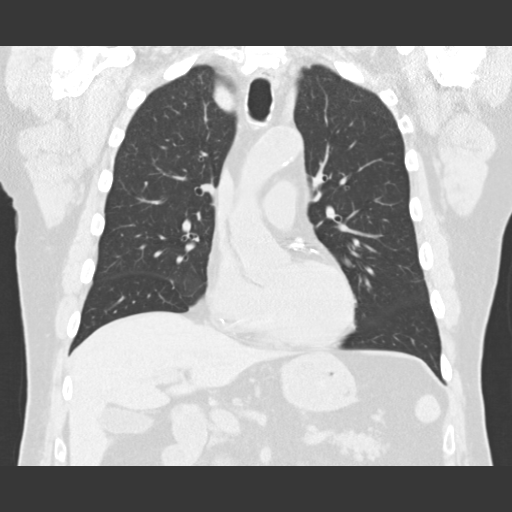
[im 75/125  lung]
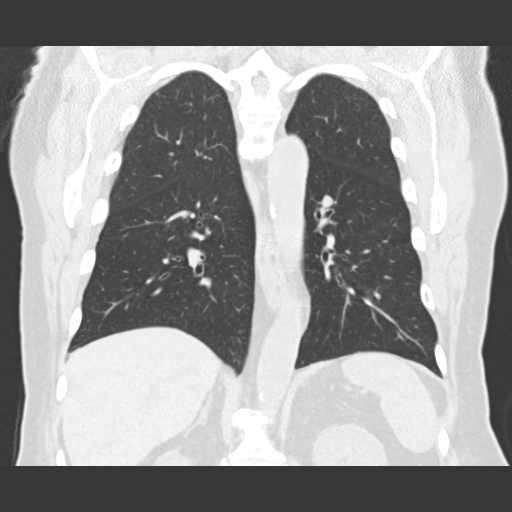

[15 of 36 positions shown; findings below may reference images not displayed]

FINDINGS: No enlarged axillary or supraclavicular adenopathy.

There is no mediastinal or hilar adenopathy.

No pericardial or pleural effusions.

No airspace consolidation identified.

Within the right lower lobe there is a pulmonary nodule measuring
0.4 cm, image 30.

Left lung is clear.

Trachea appears patent and is midline.

The esophagus appears normal.

No mass or wall thickening identified.

Limited imaging through the upper abdomen shows normal appearing
adrenal glands.
IMPRESSION: 1.  No active cardiopulmonary abnormalities.
2.  Right lower lobe pulmonary nodule measures 4 mm. If the patient
is at high risk for bronchogenic carcinoma, follow-up chest CT at 1
year is recommended.  If the patient is at low risk, no follow-up
is needed.  This recommendation follows the consensus statement:
Guidelines for Management of Small Pulmonary Nodules Detected on CT
Scans:  A Statement from the [HOSPITAL] as published in
[URL]

## 2012-07-29 ENCOUNTER — Ambulatory Visit (INDEPENDENT_AMBULATORY_CARE_PROVIDER_SITE_OTHER): Payer: Medicare Other | Admitting: Emergency Medicine

## 2012-07-29 VITALS — BP 120/64 | HR 72 | Temp 98.0°F | Resp 18

## 2012-07-29 DIAGNOSIS — L723 Sebaceous cyst: Secondary | ICD-10-CM

## 2012-07-29 DIAGNOSIS — L821 Other seborrheic keratosis: Secondary | ICD-10-CM

## 2012-07-29 NOTE — Progress Notes (Signed)
[facility] [address] [place] [address] [phone]  Date:  07/29/2012   Name:  Ian Patterson   DOB:  October 05, 1923   MRN:  [id number]  PCP:  Bonnielee Haff, MD    Chief Complaint: Cellulitis   History of Present Illness:  Ian Patterson is a 79 y.o. very pleasant male patient who presents with the following:  Multiple lesions on back.  Superior lesion is draining.  Lower lesion is bloody.  Both are painless and chronic.  Denies fever or chills or pain.  No improvement with over the counter medications or other home remedies. Denies other complaint or health concern today.   Patient Active Problem List   Diagnosis Date Noted  . Barrett esophagus 10/27/2010  . Dysphagia, unspecified 10/27/2010  . Dysphagia 10/22/2010  . GERD (gastroesophageal reflux disease) 10/22/2010  . Prostate cancer 10/22/2010  . Hematochezia 10/22/2010  . Cough with expectoration 10/22/2010  . 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 Glucose intolerance (pre-diabetes) 10/22/2010    Past Medical History  Diagnosis Date  . SOB (shortness of breath)   . Prostate cancer     hx of   . Arthritis   . Asthma   . Diabetes mellitus   . Diverticulosis   . Hyperlipemia   . Hx: UTI (urinary tract infection)   . Glaucoma     Past Surgical History  Procedure Laterality Date  . Appendectomy    . Tonsillectomy    . Prostate surgery    . Neck surgery      History  Substance Use Topics  . Smoking status: Never Smoker   . Smokeless tobacco: Never Used  . Alcohol Use: Yes     Comment: one daily     Family History  Problem Relation Age of Onset  . Colon cancer Neg Hx     Allergies  Allergen Reactions  . Penicillins     Medication list has been reviewed and updated.  Current Outpatient Prescriptions on File Prior to Visit  Medication Sig Dispense Refill  . aspirin 325 MG tablet Take 325 mg by mouth daily.        Marland Kitchen glipiZIDE (GLUCOTROL) 5 MG  tablet Take 5 mg by mouth daily.      Marland Kitchen latanoprost (XALATAN) 0.005 % ophthalmic solution Place 1 drop into both eyes at bedtime.      . Omega-3 Fatty Acids (FISH OIL) 1000 MG CAPS Take 1 capsule by mouth daily.       . pantoprazole (PROTONIX) 40 MG tablet Take 40 mg by mouth daily.      . simvastatin (ZOCOR) 40 MG tablet Take 40 mg by mouth every evening.      . terazosin (HYTRIN) 5 MG capsule Take 5 mg by mouth at bedtime.       . traMADol (ULTRAM) 50 MG tablet Take 50 mg by mouth every 8 (eight) hours as needed. Pain      . travoprost, benzalkonium, (TRAVATAN) 0.004 % ophthalmic solution Place 1 drop into both eyes 2 (two) times daily.       . ciprofloxacin (CIPRO) 500 MG tablet Take 250 mg by mouth 2 (two) times daily. Taking until procedure      . dutasteride (AVODART) 0.5 MG capsule Take 0.5 mg by mouth daily.        Current Facility-Administered Medications on File Prior to Visit  Medication Dose Route Frequency Provider Last Rate Last Dose  . 0.9 %  sodium chloride infusion  500 mL Intravenous Continuous Mardella Layman, MD        Review of Systems:  As per HPI, otherwise negative.    Physical Examination: Filed Vitals:   07/29/12 1523  BP: 120/64  Pulse: 72  Temp: 98 F (36.7 C)  Resp: 18   There were no vitals filed for this visit. There is no weight on file to calculate BMI. Ideal Body Weight:     GEN: WDWN, NAD, Non-toxic, Alert & Oriented x 3 HEENT: Atraumatic, Normocephalic.  Ears and Nose: No external deformity. EXTR: No clubbing/cyanosis/edema NEURO: Normal gait.  PSYCH: Normally interactive. Conversant. Not depressed or anxious appearing.  Calm demeanor.  Skin:  Sebaceous cyst that is wide open and draining with no infection.  Lower lesion is a seborrheic keratosis that has been avulsed.  Assessment and Plan: Benign skin lesions Local wound care   Signed,  Phillips Odor, MD

## 2013-09-27 ENCOUNTER — Encounter: Payer: Self-pay | Admitting: Gastroenterology

## 2020-09-06 ENCOUNTER — Other Ambulatory Visit (HOSPITAL_COMMUNITY): Payer: Self-pay | Admitting: Internal Medicine

## 2020-09-06 DIAGNOSIS — C61 Malignant neoplasm of prostate: Secondary | ICD-10-CM

## 2020-10-02 ENCOUNTER — Inpatient Hospital Stay (HOSPITAL_COMMUNITY)
Admission: EM | Admit: 2020-10-02 | Discharge: 2020-10-04 | DRG: 723 | Disposition: A | Discharge status: Hospice | Attending: Internal Medicine | Admitting: Internal Medicine

## 2020-10-02 ENCOUNTER — Encounter (HOSPITAL_COMMUNITY): Payer: Self-pay | Admitting: Emergency Medicine

## 2020-10-02 ENCOUNTER — Other Ambulatory Visit: Payer: Self-pay

## 2020-10-02 ENCOUNTER — Emergency Department (HOSPITAL_COMMUNITY)

## 2020-10-02 DIAGNOSIS — D6959 Other secondary thrombocytopenia: Secondary | ICD-10-CM | POA: Diagnosis present

## 2020-10-02 DIAGNOSIS — C61 Malignant neoplasm of prostate: Principal | ICD-10-CM | POA: Diagnosis present

## 2020-10-02 DIAGNOSIS — J45909 Unspecified asthma, uncomplicated: Secondary | ICD-10-CM | POA: Diagnosis present

## 2020-10-02 DIAGNOSIS — D649 Anemia, unspecified: Secondary | ICD-10-CM | POA: Diagnosis not present

## 2020-10-02 DIAGNOSIS — C7951 Secondary malignant neoplasm of bone: Secondary | ICD-10-CM | POA: Diagnosis present

## 2020-10-02 DIAGNOSIS — Z79899 Other long term (current) drug therapy: Secondary | ICD-10-CM

## 2020-10-02 DIAGNOSIS — E860 Dehydration: Secondary | ICD-10-CM | POA: Diagnosis not present

## 2020-10-02 DIAGNOSIS — M898X9 Other specified disorders of bone, unspecified site: Secondary | ICD-10-CM | POA: Diagnosis not present

## 2020-10-02 DIAGNOSIS — Z7984 Long term (current) use of oral hypoglycemic drugs: Secondary | ICD-10-CM

## 2020-10-02 DIAGNOSIS — E1165 Type 2 diabetes mellitus with hyperglycemia: Secondary | ICD-10-CM | POA: Diagnosis present

## 2020-10-02 DIAGNOSIS — N179 Acute kidney failure, unspecified: Secondary | ICD-10-CM | POA: Diagnosis not present

## 2020-10-02 DIAGNOSIS — Z20822 Contact with and (suspected) exposure to covid-19: Secondary | ICD-10-CM | POA: Diagnosis present

## 2020-10-02 DIAGNOSIS — R739 Hyperglycemia, unspecified: Secondary | ICD-10-CM

## 2020-10-02 DIAGNOSIS — D630 Anemia in neoplastic disease: Secondary | ICD-10-CM | POA: Diagnosis present

## 2020-10-02 DIAGNOSIS — Z515 Encounter for palliative care: Secondary | ICD-10-CM

## 2020-10-02 DIAGNOSIS — Z7982 Long term (current) use of aspirin: Secondary | ICD-10-CM

## 2020-10-02 DIAGNOSIS — N400 Enlarged prostate without lower urinary tract symptoms: Secondary | ICD-10-CM | POA: Diagnosis present

## 2020-10-02 DIAGNOSIS — M1990 Unspecified osteoarthritis, unspecified site: Secondary | ICD-10-CM | POA: Diagnosis present

## 2020-10-02 DIAGNOSIS — H409 Unspecified glaucoma: Secondary | ICD-10-CM | POA: Diagnosis present

## 2020-10-02 DIAGNOSIS — R54 Age-related physical debility: Secondary | ICD-10-CM | POA: Diagnosis present

## 2020-10-02 DIAGNOSIS — K219 Gastro-esophageal reflux disease without esophagitis: Secondary | ICD-10-CM | POA: Diagnosis present

## 2020-10-02 DIAGNOSIS — Z880 Allergy status to penicillin: Secondary | ICD-10-CM

## 2020-10-02 DIAGNOSIS — Z87440 Personal history of urinary (tract) infections: Secondary | ICD-10-CM

## 2020-10-02 DIAGNOSIS — G893 Neoplasm related pain (acute) (chronic): Secondary | ICD-10-CM | POA: Diagnosis present

## 2020-10-02 DIAGNOSIS — Z9049 Acquired absence of other specified parts of digestive tract: Secondary | ICD-10-CM

## 2020-10-02 DIAGNOSIS — E785 Hyperlipidemia, unspecified: Secondary | ICD-10-CM | POA: Diagnosis present

## 2020-10-02 DIAGNOSIS — Z66 Do not resuscitate: Secondary | ICD-10-CM | POA: Diagnosis present

## 2020-10-02 LAB — COMPREHENSIVE METABOLIC PANEL
ALT: 13 U/L (ref 0–44)
AST: 24 U/L (ref 15–41)
Albumin: 2.7 g/dL — ABNORMAL LOW (ref 3.5–5.0)
Alkaline Phosphatase: 4565 U/L — ABNORMAL HIGH (ref 38–126)
Anion gap: 12 (ref 5–15)
BUN: 90 mg/dL — ABNORMAL HIGH (ref 8–23)
CO2: 19 mmol/L — ABNORMAL LOW (ref 22–32)
Calcium: 8.7 mg/dL — ABNORMAL LOW (ref 8.9–10.3)
Chloride: 111 mmol/L (ref 98–111)
Creatinine, Ser: 2.64 mg/dL — ABNORMAL HIGH (ref 0.61–1.24)
GFR, Estimated: 22 mL/min — ABNORMAL LOW (ref >60)
Glucose, Bld: 553 mg/dL (ref 70–99)
Potassium: 4.3 mmol/L (ref 3.5–5.1)
Sodium: 142 mmol/L (ref 135–145)
Total Bilirubin: 1 mg/dL (ref 0.3–1.2)
Total Protein: 6.2 g/dL — ABNORMAL LOW (ref 6.5–8.1)

## 2020-10-02 LAB — CBC WITH DIFFERENTIAL/PLATELET
Abs Immature Granulocytes: 0 10*3/uL (ref 0.00–0.07)
Band Neutrophils: 3 %
Basophils Absolute: 0 10*3/uL (ref 0.0–0.1)
Basophils Relative: 0 %
Eosinophils Absolute: 0 10*3/uL (ref 0.0–0.5)
Eosinophils Relative: 1 %
HCT: 22.3 % — ABNORMAL LOW (ref 39.0–52.0)
Hemoglobin: 7.4 g/dL — ABNORMAL LOW (ref 13.0–17.0)
Lymphocytes Relative: 14 %
Lymphs Abs: 0.6 10*3/uL — ABNORMAL LOW (ref 0.7–4.0)
MCH: 31.8 pg (ref 26.0–34.0)
MCHC: 33.2 g/dL (ref 30.0–36.0)
MCV: 95.7 fL (ref 80.0–100.0)
Monocytes Absolute: 0.4 10*3/uL (ref 0.1–1.0)
Monocytes Relative: 8 %
Myelocytes: 1 %
Neutro Abs: 3.3 10*3/uL (ref 1.7–7.7)
Neutrophils Relative %: 73 %
Platelets: 88 10*3/uL — ABNORMAL LOW (ref 150–400)
RBC: 2.33 MIL/uL — ABNORMAL LOW (ref 4.22–5.81)
RDW: 14 % (ref 11.5–15.5)
WBC: 4.4 10*3/uL (ref 4.0–10.5)
nRBC: 3.7 % — ABNORMAL HIGH (ref 0.0–0.2)

## 2020-10-02 LAB — FOLATE: Folate: 6.4 ng/mL (ref >5.9)

## 2020-10-02 LAB — CBG MONITORING, ED
Glucose-Capillary: 484 mg/dL — ABNORMAL HIGH (ref 70–99)
Glucose-Capillary: 385 mg/dL — ABNORMAL HIGH (ref 70–99)

## 2020-10-02 LAB — CBC
HCT: 21.9 % — ABNORMAL LOW (ref 39.0–52.0)
Hemoglobin: 7.1 g/dL — ABNORMAL LOW (ref 13.0–17.0)
MCH: 30.9 pg (ref 26.0–34.0)
MCHC: 32.4 g/dL (ref 30.0–36.0)
MCV: 95.2 fL (ref 80.0–100.0)
Platelets: 84 10*3/uL — ABNORMAL LOW (ref 150–400)
RBC: 2.3 MIL/uL — ABNORMAL LOW (ref 4.22–5.81)
RDW: 13.7 % (ref 11.5–15.5)
WBC: 3.8 10*3/uL — ABNORMAL LOW (ref 4.0–10.5)
nRBC: 4 % — ABNORMAL HIGH (ref 0.0–0.2)

## 2020-10-02 LAB — LIPASE, BLOOD: Lipase: 25 U/L (ref 11–51)

## 2020-10-02 LAB — CREATININE, SERUM
Creatinine, Ser: 2.39 mg/dL — ABNORMAL HIGH (ref 0.61–1.24)
GFR, Estimated: 25 mL/min — ABNORMAL LOW (ref >60)

## 2020-10-02 MED ORDER — OMEGA-3-ACID ETHYL ESTERS 1 G PO CAPS
1.0000 g | ORAL_CAPSULE | Freq: Every day | ORAL | Status: DC
  Filled 2020-10-02: qty 1

## 2020-10-02 MED ORDER — SIMVASTATIN 20 MG PO TABS
40.0000 mg | ORAL_TABLET | Freq: Every evening | ORAL | Status: DC
  Filled 2020-10-02: qty 2

## 2020-10-02 MED ORDER — TRAMADOL HCL 50 MG PO TABS
50.0000 mg | ORAL_TABLET | Freq: Three times a day (TID) | ORAL | Status: DC | PRN
Start: 2020-10-02 — End: 2020-10-04

## 2020-10-02 MED ORDER — ONDANSETRON HCL 4 MG/2ML IJ SOLN
4.0000 mg | Freq: Four times a day (QID) | INTRAMUSCULAR | Status: DC | PRN
  Administered 2020-10-02 – 2020-10-04 (×3): 4 mg via INTRAVENOUS
  Filled 2020-10-02 (×3): qty 2

## 2020-10-02 MED ORDER — ALBUTEROL SULFATE (2.5 MG/3ML) 0.083% IN NEBU
2.5000 mg | INHALATION_SOLUTION | Freq: Four times a day (QID) | RESPIRATORY_TRACT | Status: DC | PRN
Start: 2020-10-02 — End: 2020-10-04

## 2020-10-02 MED ORDER — TRAZODONE HCL 50 MG PO TABS: 25.0000 mg | ORAL_TABLET | Freq: Every evening | ORAL | Status: DC | PRN

## 2020-10-02 MED ORDER — BISACODYL 5 MG PO TBEC: 5.0000 mg | DELAYED_RELEASE_TABLET | Freq: Every day | ORAL | Status: DC | PRN

## 2020-10-02 MED ORDER — ONDANSETRON HCL 4 MG/2ML IJ SOLN
4.0000 mg | Freq: Once | INTRAMUSCULAR | Status: AC
  Administered 2020-10-02: 4 mg via INTRAVENOUS
  Filled 2020-10-02: qty 2

## 2020-10-02 MED ORDER — TERAZOSIN HCL 5 MG PO CAPS
5.0000 mg | ORAL_CAPSULE | Freq: Every day | ORAL | Status: DC
  Filled 2020-10-02 (×2): qty 1

## 2020-10-02 MED ORDER — SODIUM CHLORIDE 0.9 % IV BOLUS
500.0000 mL | Freq: Once | INTRAVENOUS | Status: AC
  Administered 2020-10-02: 500 mL via INTRAVENOUS

## 2020-10-02 MED ORDER — MAGNESIUM CITRATE PO SOLN: 1.0000 | Freq: Once | ORAL | Status: DC | PRN

## 2020-10-02 MED ORDER — ACETAMINOPHEN 325 MG PO TABS: 650.0000 mg | ORAL_TABLET | Freq: Four times a day (QID) | ORAL | Status: DC | PRN

## 2020-10-02 MED ORDER — TRAVOPROST 0.004 % OP SOLN: 1.0000 [drp] | Freq: Two times a day (BID) | OPHTHALMIC | Status: DC

## 2020-10-02 MED ORDER — INSULIN ASPART 100 UNIT/ML IJ SOLN
0.0000 [IU] | Freq: Three times a day (TID) | INTRAMUSCULAR | Status: DC
  Administered 2020-10-03: 7 [IU] via SUBCUTANEOUS
  Administered 2020-10-03 – 2020-10-04 (×2): 3 [IU] via SUBCUTANEOUS
  Filled 2020-10-02: qty 0.09

## 2020-10-02 MED ORDER — PANTOPRAZOLE SODIUM 40 MG PO TBEC: 40.0000 mg | DELAYED_RELEASE_TABLET | Freq: Every day | ORAL | Status: DC

## 2020-10-02 MED ORDER — INSULIN ASPART 100 UNIT/ML IJ SOLN
0.0000 [IU] | Freq: Every day | INTRAMUSCULAR | Status: DC
  Administered 2020-10-02: 5 [IU] via SUBCUTANEOUS
  Administered 2020-10-03: 3 [IU] via SUBCUTANEOUS
  Filled 2020-10-02: qty 0.05

## 2020-10-02 MED ORDER — FISH OIL 1000 MG PO CAPS: 1.0000 | ORAL_CAPSULE | Freq: Every day | ORAL | Status: DC

## 2020-10-02 MED ORDER — SODIUM CHLORIDE 0.9 % IV BOLUS
1000.0000 mL | Freq: Once | INTRAVENOUS | Status: AC
  Administered 2020-10-02: 1000 mL via INTRAVENOUS

## 2020-10-02 MED ORDER — ONDANSETRON HCL 4 MG PO TABS: 4.0000 mg | ORAL_TABLET | Freq: Four times a day (QID) | ORAL | Status: DC | PRN

## 2020-10-02 MED ORDER — ASPIRIN 325 MG PO TABS: 325.0000 mg | ORAL_TABLET | Freq: Every day | ORAL | Status: DC

## 2020-10-02 MED ORDER — LATANOPROST 0.005 % OP SOLN
1.0000 [drp] | Freq: Every day | OPHTHALMIC | Status: DC
  Administered 2020-10-02: 1 [drp] via OPHTHALMIC
  Filled 2020-10-02: qty 2.5

## 2020-10-02 MED ORDER — ACETAMINOPHEN 650 MG RE SUPP: 650.0000 mg | Freq: Four times a day (QID) | RECTAL | Status: DC | PRN

## 2020-10-02 MED ORDER — SENNOSIDES-DOCUSATE SODIUM 8.6-50 MG PO TABS: 1.0000 | ORAL_TABLET | Freq: Every evening | ORAL | Status: DC | PRN

## 2020-10-02 MED ORDER — SODIUM CHLORIDE 0.9 % IV SOLN: INTRAVENOUS | Status: DC

## 2020-10-02 MED ORDER — IPRATROPIUM BROMIDE 0.02 % IN SOLN: 0.5000 mg | Freq: Four times a day (QID) | RESPIRATORY_TRACT | Status: DC | PRN

## 2020-10-02 MED ORDER — INSULIN ASPART 100 UNIT/ML IJ SOLN
10.0000 [IU] | Freq: Once | INTRAMUSCULAR | Status: AC
  Administered 2020-10-02: 10 [IU] via INTRAVENOUS
  Filled 2020-10-02: qty 0.1

## 2020-10-02 MED ORDER — DUTASTERIDE 0.5 MG PO CAPS
0.5000 mg | ORAL_CAPSULE | Freq: Every day | ORAL | Status: DC
  Filled 2020-10-02 (×2): qty 1

## 2020-10-02 MED ORDER — ENOXAPARIN SODIUM 30 MG/0.3ML IJ SOSY
30.0000 mg | PREFILLED_SYRINGE | INTRAMUSCULAR | Status: DC
  Administered 2020-10-02: 30 mg via SUBCUTANEOUS
  Filled 2020-10-02: qty 0.3

## 2020-10-02 MED ORDER — HYDROCODONE-ACETAMINOPHEN 5-325 MG PO TABS: 1.0000 | ORAL_TABLET | ORAL | Status: DC | PRN

## 2020-10-02 MED ORDER — MORPHINE SULFATE (PF) 2 MG/ML IV SOLN
1.0000 mg | Freq: Four times a day (QID) | INTRAVENOUS | Status: DC | PRN
Start: 2020-10-02 — End: 2020-10-04

## 2020-10-02 NOTE — H&P (Addendum)
History and Physical   [facility] [place] [facility] [place] Admission History and Physical [employer], D.O.    Patient Name: Ian Patterson MR#: [id number] Date of Birth: May 15, 1923 Date of Admission: 10/02/2020  Referring MD/NP/PA: Dr. Melina Copa Primary Care Physician: Everrett Coombe, MD  Chief Complaint:  Chief Complaint  Patient presents with   Nausea   Emesis    HPI: Ian Patterson is a 87 y.o. male with a known history of prostate cancer with mets to bone on hospice, asthma, diabetes hyperlipidemia presents to the emergency department for evaluation of bone pain.  Patient reportedly lives alone in a condo with hospice and home care checking on him regularly.  He is usually independent with his activities of daily living but states that the past 3 days his pain has prohibited him from his regular activities.  He complains of mild shortness of breath  Patient denies fevers/chills, weakness, dizziness, chest pain,  N/V/C/D, abdominal pain, dysuria/frequency, changes in mental status.    EMS/ED Course: Patient received insulin, Zofran, normal saline. Medical admission has been requested for further management of acute on chronic pain.  Review of Systems:  CONSTITUTIONAL: No fever/chills, fatigue, weakness, weight gain/loss, headache. EYES: No blurry or double vision. ENT: No tinnitus, postnasal drip, redness or soreness of the oropharynx. RESPIRATORY: Positive shortness of breath no cough, wheeze.  No hemoptysis.  CARDIOVASCULAR: No chest pain, palpitations, syncope, orthopnea. No lower extremity edema.  GASTROINTESTINAL: No nausea, vomiting, abdominal pain, diarrhea, constipation.  No hematemesis, melena or hematochezia. GENITOURINARY: No dysuria, frequency, hematuria. ENDOCRINE: No polyuria or nocturia. No heat or cold intolerance. HEMATOLOGY: No anemia, bruising, bleeding. INTEGUMENTARY: No rashes, ulcers, lesions. MUSCULOSKELETAL: Diffuse bone pain no  arthritis, gout, dyspnea. NEUROLOGIC: No numbness, tingling, ataxia, seizure-type activity, weakness. PSYCHIATRIC: No anxiety, depression, insomnia.   Past Medical History:  Diagnosis Date   Arthritis    Asthma    Diabetes mellitus    Diverticulosis    Glaucoma    Hx: UTI (urinary tract infection)    Hyperlipemia    Prostate cancer ([facility])    hx of    SOB (shortness of breath)     Past Surgical History:  Procedure Laterality Date   APPENDECTOMY     NECK SURGERY     PROSTATE SURGERY     TONSILLECTOMY       reports that he has never smoked. He has never used smokeless tobacco. He reports current alcohol use. He reports that he does not use drugs.  Allergies  Allergen Reactions   Penicillins     Family History  Problem Relation Age of Onset   Colon cancer Neg Hx     Prior to Admission medications   Medication Sig Start Date End Date Taking? Authorizing Provider  aspirin 325 MG tablet Take 325 mg by mouth daily.      [provider]  ciprofloxacin (CIPRO) 500 MG tablet Take 250 mg by mouth 2 (two) times daily. Taking until procedure    [provider]  dutasteride (AVODART) 0.5 MG capsule Take 0.5 mg by mouth daily.     [provider]  glipiZIDE (GLUCOTROL) 5 MG tablet Take 5 mg by mouth daily.    [provider]  latanoprost (XALATAN) 0.005 % ophthalmic solution Place 1 drop into both eyes at bedtime.    [provider]  Omega-3 Fatty Acids (FISH OIL) 1000 MG CAPS Take 1 capsule by mouth daily.     [provider]  pantoprazole (  PROTONIX) 40 MG tablet Take 40 mg by mouth daily.    [provider]  simvastatin (ZOCOR) 40 MG tablet Take 40 mg by mouth every evening.    [provider]  terazosin (HYTRIN) 5 MG capsule Take 5 mg by mouth at bedtime.     [provider]  traMADol (ULTRAM) 50 MG tablet Take 50 mg by mouth every 8 (eight) hours as needed. Pain    [provider]   travoprost, benzalkonium, (TRAVATAN) 0.004 % ophthalmic solution Place 1 drop into both eyes 2 (two) times daily.     [provider]    Physical Exam: Vitals:   10/02/20 1946 10/02/20 2015 10/02/20 2045 10/02/20 2115  BP: 135/66 (!) 113/50 128/61 (!) 150/68  Pulse: 75 90 89 86  Resp: 18 16 15 15   Temp:      TempSrc:      SpO2: 100% 100% 99% 100%    GENERAL: 87 y.o.-year-old male patient, thin, frail lying in the bed in no acute distress.  Pleasant and cooperative.   HEENT: Head atraumatic, normocephalic. Pupils equal. Mucus membranes dry. NECK: Supple. No JVD. CHEST: Normal breath sounds bilaterally. No wheezing, rales, rhonchi or crackles. No use of accessory muscles of respiration CARDIOVASCULAR: S1, S2 normal. No murmurs, rubs, or gallops. Cap refill <2 seconds. Pulses intact distally.  ABDOMEN: Soft, nondistended, nontender. No rebound, guarding, rigidity.  EXTREMITIES: No pedal edema, cyanosis, or clubbing. No calf tenderness or Homan's sign.  NEUROLOGIC: The patient is alert and oriented x 3. Cranial nerves II through XII are grossly intact with no focal sensorimotor deficit. PSYCHIATRIC:  Normal affect, mood, thought content.    Labs on Admission:  CBC: Recent Labs  Lab 10/02/20 1818  WBC 4.4  NEUTROABS 3.3  HGB 7.4*  HCT 22.3*  MCV 95.7  PLT 88*   Basic Metabolic Panel: Recent Labs  Lab 10/02/20 1818  NA 142  K 4.3  CL 111  CO2 19*  GLUCOSE 553*  BUN 90*  CREATININE 2.64*  CALCIUM 8.7*   GFR: CrCl cannot be calculated (Unknown ideal weight.). Liver Function Tests: Recent Labs  Lab 10/02/20 1818  AST 24  ALT 13  ALKPHOS 4,565*  BILITOT 1.0  PROT 6.2*  ALBUMIN 2.7*   Recent Labs  Lab 10/02/20 1818  LIPASE 25   No results for input(s): AMMONIA in the last 168 hours. Coagulation Profile: No results for input(s): INR, PROTIME in the last 168 hours. Cardiac Enzymes: No results for input(s): CKTOTAL, CKMB, CKMBINDEX, TROPONINI in  the last 168 hours. BNP (last 3 results) No results for input(s): PROBNP in the last 8760 hours. HbA1C: No results for input(s): HGBA1C in the last 72 hours. CBG: Recent Labs  Lab 10/02/20 1755  GLUCAP 484*   Lipid Profile: No results for input(s): CHOL, HDL, LDLCALC, TRIG, CHOLHDL, LDLDIRECT in the last 72 hours. Thyroid Function Tests: No results for input(s): TSH, T4TOTAL, FREET4, T3FREE, THYROIDAB in the last 72 hours. Anemia Panel: No results for input(s): VITAMINB12, FOLATE, FERRITIN, TIBC, IRON, RETICCTPCT in the last 72 hours. Urine analysis:    Component Value Date/Time   COLORURINE AMBER (A) 10/26/2011 0826   APPEARANCEUR TURBID (A) 10/26/2011 0826   LABSPEC 1.015 10/26/2011 0826   LABSPEC 1.025 03/19/2009 1056   PHURINE 6.0 10/26/2011 0826   GLUCOSEU 250 (A) 10/26/2011 0826   HGBUR LARGE (A) 10/26/2011 0826   BILIRUBINUR NEGATIVE 10/26/2011 0826   BILIRUBINUR Negative 03/19/2009 [place] 10/26/2011 0826  PROTEINUR 100 (A) 10/26/2011 0826   UROBILINOGEN 0.2 10/26/2011 0826   NITRITE POSITIVE (A) 10/26/2011 0826   LEUKOCYTESUR LARGE (A) 10/26/2011 0826   LEUKOCYTESUR Small 03/19/2009 1056   Sepsis Labs: @LABRCNTIP (procalcitonin:4,lacticidven:4) )No results found for this or any previous visit (from the past 240 hour(s)).   Radiological Exams on Admission: DG Chest Port 1 View  Result Date: 10/02/2020 CLINICAL DATA:  Weakness and shortness of breath. Nausea and vomiting for 3 days. Technologist notes state hospice patient. EXAM: PORTABLE CHEST 1 VIEW COMPARISON:  Remote radiograph and CT from 2012 FINDINGS: Patient's chin obscures the apices.The cardiomediastinal contours are normal. Atherosclerosis of the thoracic aorta. Pulmonary vasculature is normal. No consolidation, pleural effusion, or pneumothorax. Advanced degenerative change of the left shoulder. IMPRESSION: No acute chest finding. Electronically Signed   By: Keith Rake M.D.   On:  10/02/2020 18:48      Assessment/Plan  This is a 87 y.o. male with a history of prostate cancer with mets to bone on hospice, asthma, diabetes hyperlipidemia  BPH now being admitted with:  #. Acute kidney injury 2/2 dehydration - IV fluids and repeat BMP in AM.  - Avoid nephrotoxic medications - Bladder scan and place foley catheter if evidence of urinary retention  #. Anemia normocytic - Check stool guaiac, B12, folate, iron levels  #. Hyperglycemia without DKA - Patient received insulin and IV fluids in the emergency department - Accuchecks achs with RISS coverage - Heart healthy, carb controlled diet  #. History of hyperlipidemia - Continue Zocor  #. History of BPH - Continue Avodart, Hytrin  #. History of GERD - Continue Protonix  Admission status: Observation IV Fluids: Normal saline Diet/Nutrition: Heart healthy, carb controlled Consults called: Hospice to evaluate patient in a.m. DVT Px: Lovenox, SCDs and early ambulation. Code Status: DNR/DNI Disposition Plan: To be determined  All the records are reviewed and case discussed with ED provider. Management plans discussed with the patient and/or family who express understanding and agree with plan of care.  Harvie Bridge D.O. on 10/02/2020 at 9:34 PM CC: Primary care physician; Everrett Coombe, MD   10/02/2020, 9:34 PM

## 2020-10-02 NOTE — ED Provider Notes (Signed)
Ian Patterson   CSN: [id number] Arrival date & time: 10/02/20  1746     History Chief Complaint  Patient presents with   Nausea   Emesis    Ian Patterson is a 87 y.o. male.  He has a history of prostate cancer and he says there is metastatic disease in his bones.  He is under hospice with a [facility].  He is here for his bone pain.  Triage notes that he is here for nausea and vomiting.  He said he has had nausea and vomiting for 3 days.  He denies of any blood in the vomitus.  He does endorse being short of breath but denies any abdominal pain.  He says he lives alone in a condo.  He does state that he is on hospice and they bring him his medications.  He has a DNR DNI with him.  The history is provided by the patient.  Emesis Severity:  Moderate Duration:  3 days Timing:  Intermittent Progression:  Unchanged Associated symptoms: arthralgias   Associated symptoms: no abdominal pain, no chills, no cough, no fever, no headaches and no sore throat       Past Medical History:  Diagnosis Date   Arthritis    Asthma    Diabetes mellitus    Diverticulosis    Glaucoma    Hx: UTI (urinary tract infection)    Hyperlipemia    Prostate cancer ([place])    hx of    SOB (shortness of breath)     Patient Active Problem List   Diagnosis Date Noted   Barrett esophagus 10/27/2010   Dysphagia, unspecified(787.20) 10/27/2010   Dysphagia 10/22/2010   GERD (gastroesophageal reflux disease) 10/22/2010   Prostate cancer ([place]) 10/22/2010   Hematochezia 10/22/2010   Cough with expectoration 10/22/2010   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 Authorizing Provider  aspirin 325 MG tablet Take 325 mg by mouth daily.      [provider]  ciprofloxacin (CIPRO) 500 MG tablet Take 250 mg by mouth 2 (two) times daily. Taking until procedure    [provider]  dutasteride (AVODART) 0.5 MG capsule Take 0.5 mg by mouth daily.     [provider]  glipiZIDE (GLUCOTROL) 5 MG tablet Take 5 mg by mouth daily.    [provider]  latanoprost (XALATAN) 0.005 % ophthalmic solution Place 1 drop into both eyes at bedtime.    [provider]  Omega-3 Fatty Acids (FISH OIL) 1000 MG CAPS Take 1 capsule by mouth daily.     [provider]  pantoprazole (PROTONIX) 40 MG tablet Take 40 mg by mouth daily.    [provider]  simvastatin (ZOCOR) 40 MG tablet Take 40 mg by mouth every evening.    [provider]  terazosin (HYTRIN) 5 MG capsule Take 5 mg by mouth at bedtime.     [provider]  traMADol (ULTRAM) 50 MG tablet Take 50 mg by mouth every 8 (eight) hours as needed. Pain    [provider]  travoprost, benzalkonium, (TRAVATAN) 0.004 % ophthalmic solution Place 1 drop into both eyes 2 (two) times daily.     [provider]    Allergies    Penicillins  Review of Systems   Review of Systems  Constitutional:  Negative for chills and fever.  HENT:  Negative for ear pain and sore throat.   Eyes:  Negative for pain and visual disturbance.  Respiratory:  Positive for shortness of breath. Negative for cough.   Cardiovascular:  Negative for chest pain and palpitations.  Gastrointestinal:  Positive for nausea and vomiting. Negative for abdominal pain.  Genitourinary:  Negative for dysuria and hematuria.  Musculoskeletal:   Positive for arthralgias and back pain. Negative for gait problem.  Skin:  Negative for color change, rash and wound.  Neurological:  Negative for seizures, syncope and headaches.  All other systems reviewed and are negative.  Physical Exam Updated Vital Signs BP 137/71 (BP Location: Right Arm)   Pulse 100   Temp 97.6 F (36.4 C) (Oral)   Resp 18   SpO2 100%   Physical Exam Vitals and nursing Patterson reviewed.  Constitutional:      General: He is awake.     Appearance: He is cachectic.  HENT:     Head: Normocephalic and atraumatic.  Eyes:     Conjunctiva/sclera: Conjunctivae normal.  Cardiovascular:     Rate and Rhythm: Regular rhythm. Tachycardia present.     Heart sounds: No murmur heard. Pulmonary:     Effort: Pulmonary effort is normal. No respiratory distress.     Breath sounds: Normal breath sounds.  Abdominal:     Palpations: Abdomen is soft.     Tenderness: There is no abdominal tenderness. There is no guarding or rebound.  Musculoskeletal:        General: No deformity or signs of injury. Normal range of motion.     Cervical back: Neck supple.  Skin:    General: Skin is warm and dry.  Neurological:     General: No focal deficit present.    ED Results / Procedures / Treatments   Labs (all labs ordered are listed, but only abnormal results are displayed) Labs Reviewed  COMPREHENSIVE METABOLIC PANEL - Abnormal; Notable for the following components:      Result Value   CO2 19 (*)    Glucose, Bld 553 (*)    BUN 90 (*)    Creatinine, Ser 2.64 (*)    Calcium 8.7 (*)    Total Protein 6.2 (*)    Albumin 2.7 (*)    Alkaline Phosphatase 4,565 (*)    GFR, Estimated 22 (*)    All other components within normal limits  CBC WITH DIFFERENTIAL/PLATELET - Abnormal; Notable for the following components:   RBC 2.33 (*)    Hemoglobin 7.4 (*)    HCT 22.3 (*)    Platelets 88 (*)    nRBC 3.7 (*)    Lymphs Abs 0.6 (*)    All other components within normal limits   URINALYSIS, ROUTINE W REFLEX MICROSCOPIC - Abnormal; Notable for the following components:   APPearance HAZY (*)    Glucose, UA >=500 (*)    Hgb urine dipstick MODERATE (*)    Ketones,  ur 5 (*)    Protein, ur 30 (*)    Bacteria, UA MANY (*)    All other components within normal limits  FERRITIN - Abnormal; Notable for the following components:   Ferritin 3,070 (*)    All other components within normal limits  IRON AND TIBC - Abnormal; Notable for the following components:   TIBC 229 (*)    Saturation Ratios 42 (*)    All other components within normal limits  COMPREHENSIVE METABOLIC PANEL - Abnormal; Notable for the following components:   Sodium 148 (*)    Chloride 118 (*)    Glucose, Bld 349 (*)    BUN 77 (*)    Creatinine, Ser 2.09 (*)    Calcium 8.7 (*)    Total Protein 5.2 (*)    Albumin 2.4 (*)    Alkaline Phosphatase 3,915 (*)    GFR, Estimated 29 (*)    All other components within normal limits  CBC - Abnormal; Notable for the following components:   WBC 2.6 (*)    RBC 1.80 (*)    Hemoglobin 5.5 (*)    HCT 17.6 (*)    Platelets 66 (*)    nRBC 6.8 (*)    All other components within normal limits  CBC - Abnormal; Notable for the following components:   WBC 3.8 (*)    RBC 2.30 (*)    Hemoglobin 7.1 (*)    HCT 21.9 (*)    Platelets 84 (*)    nRBC 4.0 (*)    All other components within normal limits  CREATININE, SERUM - Abnormal; Notable for the following components:   Creatinine, Ser 2.39 (*)    GFR, Estimated 25 (*)    All other components within normal limits  HEMOGLOBIN A1C - Abnormal; Notable for the following components:   Hgb A1c MFr Bld 9.5 (*)    All other components within normal limits  CBG MONITORING, ED - Abnormal; Notable for the following components:   Glucose-Capillary 484 (*)    All other components within normal limits  CBG MONITORING, ED - Abnormal; Notable for the following components:   Glucose-Capillary 385 (*)    All other components  within normal limits  CBG MONITORING, ED - Abnormal; Notable for the following components:   Glucose-Capillary 318 (*)    All other components within normal limits  SARS CORONAVIRUS 2 (TAT 6-24 HRS)  LIPASE, BLOOD  VITAMIN B12  FOLATE  TYPE AND SCREEN  PREPARE RBC (CROSSMATCH)  ABO/RH  TYPE AND SCREEN  PREPARE RBC (CROSSMATCH)    EKG EKG Interpretation  Date/Time:  Wednesday October 02 2020 18:35:00 EDT Ventricular Rate:  108 PR Interval:  140 QRS Duration: 93 QT Interval:  343 QTC Calculation: 460 R Axis:   13 Text Interpretation: Sinus tachycardia with irregular rate RSR' in V1 or V2, right VCD or RVH increased rate from prior 9/12 Confirmed by Aletta Edouard [phone]) on 10/02/2020 6:37:26 PM  Radiology DG Chest Port 1 View  Result Date: 10/02/2020 CLINICAL DATA:  Weakness and shortness of breath. Nausea and vomiting for 3 days. Technologist notes state hospice patient. EXAM: PORTABLE CHEST 1 VIEW COMPARISON:  Remote radiograph and CT from 2012 FINDINGS: Patient's chin obscures the apices.The cardiomediastinal contours are normal. Atherosclerosis of the thoracic aorta. Pulmonary vasculature is normal. No consolidation, pleural effusion, or pneumothorax. Advanced degenerative change of the left shoulder. IMPRESSION: No acute chest finding. Electronically Signed   By: Keith Rake M.D.   On: 10/02/2020  18:48    Procedures Procedures   Medications Ordered in ED Medications  aspirin tablet 325 mg (has no administration in time range)  traMADol (ULTRAM) tablet 50 mg (has no administration in time range)  simvastatin (ZOCOR) tablet 40 mg (40 mg Oral Patient Refused/Not Given 10/02/20 2320)  terazosin (HYTRIN) capsule 5 mg (5 mg Oral Patient Refused/Not Given 10/02/20 2321)  pantoprazole (PROTONIX) EC tablet 40 mg (has no administration in time range)  dutasteride (AVODART) capsule 0.5 mg (has no administration in time range)  latanoprost (XALATAN) 0.005 % ophthalmic solution 1  drop (1 drop Both Eyes Given 10/02/20 2317)  0.9 %  sodium chloride infusion ( Intravenous Infusion Verify 10/03/20 0623)  acetaminophen (TYLENOL) tablet 650 mg (has no administration in time range)    Or  acetaminophen (TYLENOL) suppository 650 mg (has no administration in time range)  morphine 2 MG/ML injection 1 mg (has no administration in time range)  traZODone (DESYREL) tablet 25 mg (has no administration in time range)  senna-docusate (Senokot-S) tablet 1 tablet (has no administration in time range)  bisacodyl (DULCOLAX) EC tablet 5 mg (has no administration in time range)  magnesium citrate solution 1 Bottle (has no administration in time range)  ondansetron (ZOFRAN) tablet 4 mg ( Oral See Alternative 10/02/20 2315)    Or  ondansetron (ZOFRAN) injection 4 mg (4 mg Intravenous Given 10/02/20 2315)  albuterol (PROVENTIL) (2.5 MG/3ML) 0.083% nebulizer solution 2.5 mg (has no administration in time range)  ipratropium (ATROVENT) nebulizer solution 0.5 mg (has no administration in time range)  enoxaparin (LOVENOX) injection 30 mg (30 mg Subcutaneous Given 10/02/20 2316)  HYDROcodone-acetaminophen (NORCO/VICODIN) 5-325 MG per tablet 1-2 tablet (has no administration in time range)  insulin aspart (novoLOG) injection 0-9 Units (has no administration in time range)  insulin aspart (novoLOG) injection 0-5 Units (5 Units Subcutaneous Given 10/02/20 2314)  omega-3 acid ethyl esters (LOVAZA) capsule 1 g (has no administration in time range)  0.9 %  sodium chloride infusion (Manually program via Guardrails IV Fluids) (has no administration in time range)  sodium chloride 0.9 % bolus 1,000 mL (0 mLs Intravenous Stopped 10/02/20 2047)  ondansetron (ZOFRAN) injection 4 mg (4 mg Intravenous Given 10/02/20 1816)  insulin aspart (novoLOG) injection 10 Units (10 Units Intravenous Given 10/02/20 2054)  sodium chloride 0.9 % bolus 500 mL (0 mLs Intravenous Stopped 10/02/20 2351)  0.9 %  sodium chloride infusion  (Manually program via Guardrails IV Fluids) ( Intravenous Stopped 10/03/20 0529)    ED Course  I have reviewed the triage vital signs and the nursing notes.  Pertinent labs & imaging results that were available during my care of the patient were reviewed by me and considered in my medical decision making (see chart for details).  Clinical Course as of 10/03/20 1005  Wed Oct 02, 2020  1828 Discussed with hospice nurse.  She said that he has not taken any of his medications in the last few days.  Has had increasing nausea and vomiting which are chronic for him but have worsened.  They were unable to get him into a hospice bed and so family wanted him to be evaluated in the hospital.  They are hoping that he could be observed tonight and they would see him tomorrow to see if they can get him into a hospice bed.  They are also concerned that he may have an obstruction and if so they would put [facility] into [facility] for more end-of-life type care [MB]  2111 Patient's  hemoglobin is down.  I was going to a rectal exam on the patient. His daughter was there.  She reiterated that he is on hospice and he would not any prolonging interventions including transfusion.  We will talk to the hospitalist regarding admission. [MB]  2128 Discussed with [facility] hospitalist Dr. Ara Kussmaul who will evaluate the patient for admission. [MB]    Clinical Course User Index [MB] Hayden Rasmussen, MD   MDM Rules/Calculators/A&P                          This patient complains of nausea and vomiting, bone pain, generalized weakness; this involves an extensive number of treatment Options and is a complaint that carries with it a high risk of complications and Morbidity. The differential includes metastatic cancer, metabolic derangement, anemia, dehydration, renal failure, metabolic derangement  I ordered, reviewed and interpreted labs, which included CBC with normal white count, hemoglobin lower than priors ovals were quite sometime  ago chemistries with elevated blood sugar elevated creatinine, elevated alk phos (likely reflecting his metastatic disease) I ordered medication IV fluids, nausea medication, IV insulin I ordered imaging studies which included chest x-ray and I independently    visualized and interpreted imaging which showed no acute findings Additional history obtained from patient's daughter and [facility] hospice nurse Previous records obtained and reviewed in epic, minimal, appears he receives most of his prior care at the [place] I consulted [facility] hospitalist Dr. Ara Kussmaul and discussed lab and imaging findings  Critical Interventions: None  After the interventions stated above, I reevaluated the patient and found patient to be hemodynamically stable although quite frail.  He will need admission to the hospital for further hydration.  Anticipate hospice and daughter to further clarify goals of care during this admission.   Final Clinical Impression(s) / ED Diagnoses Final diagnoses:  Hyperglycemia  AKI (acute kidney injury) ([place])  Anemia, unspecified type  Prostate cancer metastatic to bone [facility])    Rx / DC Orders ED Discharge Orders     None        Hayden Rasmussen, MD 10/03/20 1011

## 2020-10-02 NOTE — ED Notes (Signed)
Patient refusing the oral Hytrin and Simvastatin. Dr. Ara Kussmaul notified. Patient said the oral pills are making him nauseous when he swallows. RN gave patient IV zofran, patient still said he does not want to take them because he thinks he will throw them right back up.

## 2020-10-02 NOTE — ED Notes (Signed)
Patient cleaned up. Condom catheter placed on patient.

## 2020-10-02 NOTE — ED Triage Notes (Signed)
BIBA Per EMS: Pt coming from home with complaints of N/V x3 days. Pt on hospice. Hx of diabetes. CBG reading high on EMS reading. Compazine given by hospice nurse; 500 mL fluids given en route.

## 2020-10-02 NOTE — ED Notes (Addendum)
Patient refuses the occult/POC stool sample. Patient educated on importance of this lab test to determine if patient is bleeding out of his rectum. Patients daughter at bedside and verbalizes understanding with patient.

## 2020-10-03 DIAGNOSIS — K219 Gastro-esophageal reflux disease without esophagitis: Secondary | ICD-10-CM | POA: Diagnosis present

## 2020-10-03 DIAGNOSIS — M898X9 Other specified disorders of bone, unspecified site: Secondary | ICD-10-CM | POA: Diagnosis present

## 2020-10-03 DIAGNOSIS — Z87440 Personal history of urinary (tract) infections: Secondary | ICD-10-CM | POA: Diagnosis not present

## 2020-10-03 DIAGNOSIS — C61 Malignant neoplasm of prostate: Secondary | ICD-10-CM | POA: Diagnosis present

## 2020-10-03 DIAGNOSIS — Z7982 Long term (current) use of aspirin: Secondary | ICD-10-CM | POA: Diagnosis not present

## 2020-10-03 DIAGNOSIS — Z66 Do not resuscitate: Secondary | ICD-10-CM | POA: Diagnosis present

## 2020-10-03 DIAGNOSIS — D649 Anemia, unspecified: Secondary | ICD-10-CM | POA: Diagnosis not present

## 2020-10-03 DIAGNOSIS — E785 Hyperlipidemia, unspecified: Secondary | ICD-10-CM | POA: Diagnosis present

## 2020-10-03 DIAGNOSIS — H409 Unspecified glaucoma: Secondary | ICD-10-CM | POA: Diagnosis present

## 2020-10-03 DIAGNOSIS — Z79899 Other long term (current) drug therapy: Secondary | ICD-10-CM | POA: Diagnosis not present

## 2020-10-03 DIAGNOSIS — R739 Hyperglycemia, unspecified: Secondary | ICD-10-CM

## 2020-10-03 DIAGNOSIS — E1165 Type 2 diabetes mellitus with hyperglycemia: Secondary | ICD-10-CM | POA: Diagnosis present

## 2020-10-03 DIAGNOSIS — G893 Neoplasm related pain (acute) (chronic): Secondary | ICD-10-CM | POA: Diagnosis present

## 2020-10-03 DIAGNOSIS — J45909 Unspecified asthma, uncomplicated: Secondary | ICD-10-CM | POA: Diagnosis present

## 2020-10-03 DIAGNOSIS — Z9049 Acquired absence of other specified parts of digestive tract: Secondary | ICD-10-CM | POA: Diagnosis not present

## 2020-10-03 DIAGNOSIS — N400 Enlarged prostate without lower urinary tract symptoms: Secondary | ICD-10-CM | POA: Diagnosis present

## 2020-10-03 DIAGNOSIS — Z515 Encounter for palliative care: Secondary | ICD-10-CM | POA: Diagnosis not present

## 2020-10-03 DIAGNOSIS — C7951 Secondary malignant neoplasm of bone: Secondary | ICD-10-CM | POA: Diagnosis present

## 2020-10-03 DIAGNOSIS — Z880 Allergy status to penicillin: Secondary | ICD-10-CM | POA: Diagnosis not present

## 2020-10-03 DIAGNOSIS — M1990 Unspecified osteoarthritis, unspecified site: Secondary | ICD-10-CM | POA: Diagnosis present

## 2020-10-03 DIAGNOSIS — E860 Dehydration: Secondary | ICD-10-CM | POA: Diagnosis present

## 2020-10-03 DIAGNOSIS — N179 Acute kidney failure, unspecified: Secondary | ICD-10-CM

## 2020-10-03 DIAGNOSIS — Z20822 Contact with and (suspected) exposure to covid-19: Secondary | ICD-10-CM | POA: Diagnosis present

## 2020-10-03 DIAGNOSIS — R54 Age-related physical debility: Secondary | ICD-10-CM | POA: Diagnosis present

## 2020-10-03 DIAGNOSIS — Z7984 Long term (current) use of oral hypoglycemic drugs: Secondary | ICD-10-CM | POA: Diagnosis not present

## 2020-10-03 DIAGNOSIS — D630 Anemia in neoplastic disease: Secondary | ICD-10-CM | POA: Diagnosis present

## 2020-10-03 DIAGNOSIS — D6959 Other secondary thrombocytopenia: Secondary | ICD-10-CM | POA: Diagnosis present

## 2020-10-03 LAB — URINALYSIS, ROUTINE W REFLEX MICROSCOPIC
Bilirubin Urine: NEGATIVE
Glucose, UA: >=500 mg/dL — AB
Ketones, ur: 5 mg/dL — AB
Leukocytes,Ua: NEGATIVE
Nitrite: NEGATIVE
Protein, ur: 30 mg/dL — AB
Specific Gravity, Urine: 1.018 (ref 1.005–1.030)
pH: 5 (ref 5.0–8.0)

## 2020-10-03 LAB — COMPREHENSIVE METABOLIC PANEL
ALT: 12 U/L (ref 0–44)
AST: 27 U/L (ref 15–41)
Albumin: 2.4 g/dL — ABNORMAL LOW (ref 3.5–5.0)
Alkaline Phosphatase: 3915 U/L — ABNORMAL HIGH (ref 38–126)
Anion gap: 7 (ref 5–15)
BUN: 77 mg/dL — ABNORMAL HIGH (ref 8–23)
CO2: 23 mmol/L (ref 22–32)
Calcium: 8.7 mg/dL — ABNORMAL LOW (ref 8.9–10.3)
Chloride: 118 mmol/L — ABNORMAL HIGH (ref 98–111)
Creatinine, Ser: 2.09 mg/dL — ABNORMAL HIGH (ref 0.61–1.24)
GFR, Estimated: 29 mL/min — ABNORMAL LOW (ref >60)
Glucose, Bld: 349 mg/dL — ABNORMAL HIGH (ref 70–99)
Potassium: 3.9 mmol/L (ref 3.5–5.1)
Sodium: 148 mmol/L — ABNORMAL HIGH (ref 135–145)
Total Bilirubin: 0.6 mg/dL (ref 0.3–1.2)
Total Protein: 5.2 g/dL — ABNORMAL LOW (ref 6.5–8.1)

## 2020-10-03 LAB — PREPARE RBC (CROSSMATCH)

## 2020-10-03 LAB — CBG MONITORING, ED
Glucose-Capillary: 318 mg/dL — ABNORMAL HIGH (ref 70–99)
Glucose-Capillary: 333 mg/dL — ABNORMAL HIGH (ref 70–99)
Glucose-Capillary: 281 mg/dL — ABNORMAL HIGH (ref 70–99)
Glucose-Capillary: 250 mg/dL — ABNORMAL HIGH (ref 70–99)

## 2020-10-03 LAB — CBC
HCT: 17.6 % — ABNORMAL LOW (ref 39.0–52.0)
Hemoglobin: 5.5 g/dL — CL (ref 13.0–17.0)
MCH: 30.6 pg (ref 26.0–34.0)
MCHC: 31.3 g/dL (ref 30.0–36.0)
MCV: 97.8 fL (ref 80.0–100.0)
Platelets: 66 10*3/uL — ABNORMAL LOW (ref 150–400)
RBC: 1.8 MIL/uL — ABNORMAL LOW (ref 4.22–5.81)
RDW: 13.9 % (ref 11.5–15.5)
WBC: 2.6 10*3/uL — ABNORMAL LOW (ref 4.0–10.5)
nRBC: 6.8 % — ABNORMAL HIGH (ref 0.0–0.2)

## 2020-10-03 LAB — IRON AND TIBC
Iron: 95 ug/dL (ref 45–182)
Saturation Ratios: 42 % — ABNORMAL HIGH (ref 17.9–39.5)
TIBC: 229 ug/dL — ABNORMAL LOW (ref 250–450)
UIBC: 134 ug/dL

## 2020-10-03 LAB — FERRITIN: Ferritin: 3070 ng/mL — ABNORMAL HIGH (ref 24–336)

## 2020-10-03 LAB — ABO/RH: ABO/RH(D): A NEG

## 2020-10-03 LAB — VITAMIN B12: Vitamin B-12: 571 pg/mL (ref 180–914)

## 2020-10-03 LAB — HEMOGLOBIN A1C
Hgb A1c MFr Bld: 9.5 % — ABNORMAL HIGH (ref 4.8–5.6)
Mean Plasma Glucose: 225.95 mg/dL

## 2020-10-03 LAB — SARS CORONAVIRUS 2 (TAT 6-24 HRS): SARS Coronavirus 2: NEGATIVE

## 2020-10-03 LAB — GLUCOSE, CAPILLARY: Glucose-Capillary: 252 mg/dL — ABNORMAL HIGH (ref 70–99)

## 2020-10-03 MED ORDER — INSULIN GLARGINE 100 UNIT/ML ~~LOC~~ SOLN
10.0000 [IU] | Freq: Every day | SUBCUTANEOUS | Status: DC
  Administered 2020-10-03: 10 [IU] via SUBCUTANEOUS
  Filled 2020-10-03 (×2): qty 0.1

## 2020-10-03 MED ORDER — SODIUM CHLORIDE 0.9% IV SOLUTION: Freq: Once | INTRAVENOUS | Status: DC

## 2020-10-03 MED ORDER — SODIUM CHLORIDE 0.9% IV SOLUTION: Freq: Once | INTRAVENOUS | Status: AC

## 2020-10-03 NOTE — ED Notes (Signed)
After receiving IV Zofran, pt requested coffee and was noted to have an episode of coughing after swallowing. Dr. [place] Pink messaged and made aware of potential aspiration precautions. No additional orders at this time. Will continue to monitor.

## 2020-10-03 NOTE — ED Notes (Signed)
This nurse contacted Ian Patterson with social work. Per Ian Patterson, social work is unaware of this pt. Ian Patterson, pt's [facility] nurse notified and made aware.

## 2020-10-03 NOTE — ED Notes (Signed)
Pt states he is having difficulty swallowing and does not want to take his morning PO medications. Dr. Maryland Pink notified and aware. Morning PO medications not given.

## 2020-10-03 NOTE — ED Notes (Signed)
Offered patient pain medication again. Patient denied.

## 2020-10-03 NOTE — ED Notes (Signed)
Pt asked for coffee Pt provided with same and straw Pt took two sufficient sips of coffee and after swallowing both times had a significant episode of coughing RN Lovena Le made aware

## 2020-10-03 NOTE — ED Notes (Signed)
Pt's [facility] nurse at the bedside.

## 2020-10-03 NOTE — ED Notes (Signed)
Per Blood bank, no need to recollect type and screen Pt's blood band from initial type and screen, [id number], remains on pt Lab reports 2 units available for pt now

## 2020-10-03 NOTE — ED Notes (Signed)
Pt given lunch tray and states he cannot eat right now because of the nausea. Pt informed that if the nausea subsides and he feels like eating staff will warm his food up. Of note, pt did not eat his breakfast either

## 2020-10-03 NOTE — Progress Notes (Addendum)
    OVERNIGHT PROGRESS REPORT  Order was placed for blood transfusion for patient due to Lab results.  Patient has refused until his daughter(whom he states as his POA) agrees.  RN has been unable to speak to her as of this note. Attempts will continue.   Update: RN has spoken with daughter who does state that she has a medical POA and will provide it later today. She states that she will let him decide tonight and as of this note update he refuses transfusion.  Gershon Cull MSNA MSN [id number]-AG Acute Care Nurse Practitioner [place]

## 2020-10-03 NOTE — ED Notes (Signed)
RN went to draw patients labs for the type and screen due to hemoglobin resulting at 5.5. RN told the patient he would receive a blood transfusion. Patient said "I don't want all that, the quicker I can go I want to go. Is this going to keep me alive because I don't think I want all that." RN notified J. Olena Heckle, APP. Patient said "I want whatever my daughter wants for me to do, she is my power of attorney." Daughter is not present at this time.

## 2020-10-03 NOTE — ED Notes (Signed)
Report sent to floor nurse.  

## 2020-10-03 NOTE — ED Notes (Signed)
Clarene Essex, APP notified of patients hemoglobin of 5.5.

## 2020-10-03 NOTE — Progress Notes (Signed)
[facility] PROGRESS NOTE   Ian Patterson [id number] DOB: 05/05/1923 DOA: 10/02/2020  PCP: Everrett Coombe, MD  Brief History/Interval Summary: 87 y.o. male with a known history of prostate cancer with mets to bone under hospice care at home, asthma, diabetes hyperlipidemia presented to the emergency department for evaluation of bone pain.  Patient reportedly lives alone in a condo with hospice and home care checking on him regularly.  He was independent with his activities of daily living sober for about 3 days prior to this admission pain has worsened and has limited his activities.  Patient found to be anemic and dehydrated.  Hospitalized for further management.    Reason for Visit: Metastatic prostate cancer with cancer associated pain  Consultants: None yet  Procedures:  blood transfusion  Antibiotics: Anti-infectives (From admission, onward)    None       Subjective/Interval History: Overnight events noted.  Apparently patient had given the impression that he did not want blood transfusion which is daughter reiterated.  This morning when I spoke to him he mentions that he is okay with blood transfusion.  Spoke to the daughter again who would support his decision.  Patient complains of nausea and inability to keep anything down.  Denies any abdominal pain per se.     Assessment/Plan:  Nausea His abdomen is benign.  Suspect that this is secondary to his malignancy to some extent.  Treat symptomatically.  Since abdomen is benign will not pursue any imaging studies since his prognosis appears to be poor due to underlying cancer.  Severe symptomatic anemia Hemoglobin was 7.1 last night.  Noted to be 5.5 this morning.  No evidence of overt blood loss.  This is likely a dilutional drop.  2 units of PRBC were ordered overnight but not given since patient initially appeared to be declining.  This morning he is agreeable.  Will order 2 units.  Metastatic prostate  cancer/cancer associated pain He was under hospice services at home.  Discussed with daughter.  Prognosis appears to be poor.  It appears that plan was to transition him to residential hospice in the near future.  We will request hospice to facilitate this transition.  Patient has bony metastases.  His alkaline phosphatase is noted to be significantly elevated.  Patient has been declining pain medications.  They are available if he changes his mind.  Thrombocytopenia Most likely secondary to malignancy.  Acute kidney injury Thought to be due to dehydration from poor oral intake.  Noted to be on IV fluids.  Renal function improved slightly this morning.  Again prognosis thought to be poor due to his metastatic prostate cancer which could also be contributing to his renal failure.  Does not appear to be retaining any urine however.  Diabetes mellitus type 2, uncontrolled with hyperglycemia Blood glucose levels noted to be significantly elevated.  Noted to be on glipizide at home.  Continue SSI.  May need long-acting insulin.  History of hyperlipidemia Continue statin.  GERD Continue PPI  Goals of care Based on discussions with daughter it appears that the plan was for transition to residential hospice in the near future.  Goal will be to mainly keep him comfortable.  However he is agreeable to blood transfusion which we will provide.  Will not be too aggressive with other abnormalities.  Main focus will be symptom control.   DVT Prophylaxis: We will stop Lovenox since goal seems to be mainly comfort. Code Status: DNR Family Communication: Discussed with patient's  daughter Disposition Plan: Possible transition to residential hospice  Status is: Observation  The patient will require care spanning > 2 midnights and should be moved to inpatient because: Ongoing active pain requiring inpatient pain management and IV treatments appropriate due to intensity of illness or inability to take  PO  Dispo: The patient is from: Home              Anticipated d/c is to:  Residential hospice              Patient currently is not medically stable to d/c.   Difficult to place patient No         Medications: Scheduled:  sodium chloride   Intravenous Once   aspirin  325 mg Oral Daily   dutasteride  0.5 mg Oral Daily   enoxaparin (LOVENOX) injection  30 mg Subcutaneous Q24H   insulin aspart  0-5 Units Subcutaneous QHS   insulin aspart  0-9 Units Subcutaneous TID WC   latanoprost  1 drop Both Eyes QHS   omega-3 acid ethyl esters  1 g Oral Daily   pantoprazole  40 mg Oral Daily   simvastatin  40 mg Oral QPM   terazosin  5 mg Oral QHS   Continuous:  sodium chloride     sodium chloride 75 mL/hr at 10/03/20 [id number]   [id number] **OR** acetaminophen, albuterol, bisacodyl, HYDROcodone-acetaminophen, ipratropium, magnesium citrate, morphine injection, ondansetron **OR** ondansetron (ZOFRAN) IV, senna-docusate, traMADol, traZODone   Objective:  Vital Signs  Vitals:   10/03/20 0630 10/03/20 0700 10/03/20 0800 10/03/20 0803  BP: (!) 123/46 (!) 132/56 (!) 138/53   Pulse: 76 81 83   Resp: 15 15    Temp:    97.6 F (36.4 C)  TempSrc:   Oral Oral  SpO2: 100% 100% 100%   Weight:   68 kg   Height:   '5\' 7"'$  (1.702 m)     Intake/Output Summary (Last 24 hours) at 10/03/2020 1022 Last data filed at 10/03/2020 0529 Gross per 24 hour  Intake 1516 ml  Output --  Net 1516 ml   Filed Weights   10/03/20 0800  Weight: 68 kg    General appearance: Awake alert.  In no distress Resp: Clear to auscultation bilaterally.  Normal effort Cardio: S1-S2 is normal regular.  No S3-S4.  No rubs murmurs or bruit GI: Abdomen is soft.  Nontender nondistended.  Bowel sounds are present normal.  No masses organomegaly Extremities: No edema.   Neurologic:   No focal neurological deficits.    Lab Results:  Data Reviewed: I have personally reviewed following labs and imaging  studies  CBC: Recent Labs  Lab 10/02/20 1818 10/02/20 2214 10/03/20 0303  WBC 4.4 3.8* 2.6*  NEUTROABS 3.3  --   --   HGB 7.4* 7.1* 5.5*  HCT 22.3* 21.9* 17.6*  MCV 95.7 95.2 97.8  PLT 88* 84* 66*    Basic Metabolic Panel: Recent Labs  Lab 10/02/20 1818 10/02/20 2214 10/03/20 0303  NA 142  --  148*  K 4.3  --  3.9  CL 111  --  118*  CO2 19*  --  23  GLUCOSE 553*  --  349*  BUN 90*  --  77*  CREATININE 2.64* 2.39* 2.09*  CALCIUM 8.7*  --  8.7*    GFR: Estimated Creatinine Clearance: 20.2 mL/min (A) (by C-G formula based on SCr of 2.09 mg/dL (H)).  Liver Function Tests: Recent Labs  Lab 10/02/20 1818 10/03/20 0303  AST  24 27  ALT 13 12  ALKPHOS 4,565* 3,915*  BILITOT 1.0 0.6  PROT 6.2* 5.2*  ALBUMIN 2.7* 2.4*    Recent Labs  Lab 10/02/20 1818  LIPASE 25     HbA1C: Recent Labs    10/02/20 2214  HGBA1C 9.5*    CBG: Recent Labs  Lab 10/02/20 1755 10/02/20 2244 10/03/20 0759  GLUCAP 484* 385* 318*    Anemia Panel: Recent Labs    10/02/20 2214  VITAMINB12 571  FOLATE 6.4  FERRITIN 3,070*  TIBC 229*  IRON 95    Recent Results (from the past 240 hour(s))  SARS CORONAVIRUS 2 (TAT 6-24 HRS) Nasopharyngeal Nasopharyngeal Swab     Status: None   Collection Time: 10/02/20  6:32 PM   Specimen: Nasopharyngeal Swab  Result Value Ref Range Status   SARS Coronavirus 2 NEGATIVE NEGATIVE Final    Comment: (NOTE) SARS-CoV-2 target nucleic acids are NOT DETECTED.  The SARS-CoV-2 RNA is generally detectable in upper and lower respiratory specimens during the acute phase of infection. Negative results do not preclude SARS-CoV-2 infection, do not rule out co-infections with other pathogens, and should not be used as the sole basis for treatment or other patient management decisions. Negative results must be combined with clinical observations, patient history, and epidemiological information. The expected result is Negative.  Fact Sheet for  Patients: SugarRoll.be  Fact Sheet for Healthcare Providers: https://www.woods-mathews.com/  This test is not yet approved or cleared by the [place] FDA and  has been authorized for detection and/or diagnosis of SARS-CoV-2 by FDA under an Emergency Use Authorization (EUA). This EUA will remain  in effect (meaning this test can be used) for the duration of the COVID-19 declaration under Se ction 564(b)(1) of the Act, 21 U.S.C. section 360bbb-3(b)(1), unless the authorization is terminated or revoked sooner.  Performed at [facility], [place] [address], [place], [place] [address]       Radiology Studies: DG Chest Port 1 View  Result Date: 10/02/2020 CLINICAL DATA:  Weakness and shortness of breath. Nausea and vomiting for 3 days. Technologist notes state hospice patient. EXAM: PORTABLE CHEST 1 VIEW COMPARISON:  Remote radiograph and CT from 2012 FINDINGS: Patient's chin obscures the apices.The cardiomediastinal contours are normal. Atherosclerosis of the thoracic aorta. Pulmonary vasculature is normal. No consolidation, pleural effusion, or pneumothorax. Advanced degenerative change of the left shoulder. IMPRESSION: No acute chest finding. Electronically Signed   By: Keith Rake M.D.   On: 10/02/2020 18:48       LOS: 0 days   [facility] Pager on [email or web address]  10/03/2020, 10:22 AM

## 2020-10-03 NOTE — ED Notes (Signed)
Pt A&o x4. NAD. Attached to cardiac monitor x3. VSS. Pt declines any complaints or concerns at this time. Condom cath in place.

## 2020-10-03 NOTE — Progress Notes (Signed)
.  Transition of Care [facility]) - Emergency Department Mini Assessment   Patient Details  Name: Ian Patterson MRN: [id number] Date of Birth: 09-18-1923  Transition of Care [facility]) CM/SW Contact:    Illene Regulus, LCSW Phone Number: 10/03/2020, 2:48 PM   Clinical Narrative: CSW was consulted about pt's d/c plans. CSW spoke with Jhonnie Garner from [facility] and confirmed that this pt is active with hospice services. She also reported pt will be transfer to residential hospice when pt has completed his blood transfusion and d/c tomorrow morning. CSW was informed that a hospice SW will meet with the family to go over paperwork for residential hospice. TOC sign off.  ED Mini Assessment:    Barriers to Discharge: Continued Medical Work up             Patient Contact and Communications        ,                 Admission diagnosis:  Dehydration [E86.0] Malignant neoplasm of prostate metastatic to bone [facility]) [C61, C79.51] Patient Active Problem List   Diagnosis Date Noted   Malignant neoplasm of prostate metastatic to bone ([place]) 10/03/2020   Dehydration 10/02/2020   Barrett esophagus 10/27/2010   Dysphagia, unspecified(787.20) 10/27/2010   Dysphagia 10/22/2010   GERD (gastroesophageal reflux disease) 10/22/2010   Prostate cancer (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

## 2020-10-03 NOTE — ED Notes (Signed)
Pt has not eaten breakfast d/t swallowing difficulty. Hospitalist notified and aware. Per Dr. Maryland Pink, pt to receive Novolog sliding scale insulin, despite lack of PO intake, given pt's BG of 318.

## 2020-10-03 NOTE — ED Notes (Addendum)
Per nightshift RN Burnis Medin, RN., pt declined overnight blood transfusion with his daughter, Henrietta Dine, Juanda Bond , aware and verifying that both she and the pt did not want him to receive the blood transfusion. This nurse was notified by Dr. Maryland Pink that after speaking with the pt this morning, the pt does in fact want to receive the blood transfusion. This nurse verified with the Hospitalist, Dr. Maryland Pink, that the pt is of sound mind to make his own medical decisions, which Dr, Maryland Pink verifies and agrees is correct. Per Dr. Maryland Pink, he has also spoken with the pt's daughter, Juanda Bond, who is also the pt's POA and daughter states that she wants to respect her father's wishes to receive the blood transfusion. This nurse also called and spoke with the pt's daughter, Juanda Bond, to verify, which pt's daughter did. This nurse spoke with the pt to verify he wants to receive the blood transfusion, which he states that he does. Pt signed blood consent form.

## 2020-10-03 NOTE — ED Notes (Signed)
RN offered patient pain medication. Patient shakes his head no. RN told patient that comfort and pain free is the goal.

## 2020-10-03 NOTE — ED Notes (Signed)
Blood transfusion rate change to 137mL/hr from 40mL/hr.

## 2020-10-03 NOTE — ED Notes (Signed)
Per lab, pt has Type and Screen completed, which was sent by the nightshift RN, and blood ready in the blood bank.

## 2020-10-03 NOTE — Progress Notes (Signed)
[facility]) Hospitalized Hospice patient visit  Patient is a current patient with [facility], with a terminal diagnosis of Cancer of the prostate and bone. 911 was activated on the evening of 7.20 due to uncontrolled nausea and vomiting. Once in the ED patient was found to have critically low hemoglobin. Patient is admitted to [facility] and remains in the ED. Per Dr. Konrad Dolores with [facility] this is a related admission.   Visited with patient at bedside and exchanged report with bedside nurse as well as communicating with patient's daughter, hospice team and [facility] team. Patient wishes to go to [facility] for symptom management of his ongoing nausea/vomiting and pain however at this time he is receiving blood transfusions that will likely last into the night. [facility] will be able to accept the patient tomorrow morning 7.21.2022.   VS- T- 98, HR- 80, BP- 138/45, Resp- 18, spO2 100 on room air Intake/Output- none recorded at this time Abnormal labs- Na+ 148, Glucose 349, BUN 77, Creatinine 2.09, Calcium 8.7, Alk Phos 3.9, Albumin 2.4, Hemoglobin 5.5, Hematocrit 17.6,  Diagnostics- CLINICAL DATA:  Weakness and shortness of breath. Nausea and vomiting for 3 days. Technologist notes state hospice patient.   EXAM: PORTABLE CHEST 1 VIEW   COMPARISON:  Remote radiograph and CT from 2012   FINDINGS: Patient's chin obscures the apices.The cardiomediastinal contours are normal. Atherosclerosis of the thoracic aorta. Pulmonary vasculature is normal. No consolidation, pleural effusion, or pneumothorax. Advanced degenerative change of the left shoulder.   IMPRESSION: No acute chest finding. IV/PRN Meds- IV Zofran Discharge Planning- Plan is for patient to discharge to [facility] tomorrow morning. Family contact- Communicated with daughter Stanton Kidney IDT- Updated Goals of care- Clear patient wishes to transfer to [facility] in order to get his  symptoms under control.  Transfer summary and med list placed on patient's shadow chart.  Please use [facility] when patient is ready for transport as they contract with our hospice patients.  Jhonnie Garner, Therapist, sports, BSN, [facility] Liaison [phone]

## 2020-10-03 NOTE — Progress Notes (Signed)
    OVERNIGHT PROGRESS REPORT  Notified by RN of lab value hemoglobin 5.5. 2 units PRBC ordered.  Gershon Cull MSNA MSN [id number]-AG Acute Care Nurse Practitioner [place]

## 2020-10-03 NOTE — ED Notes (Addendum)
RN called Juanda Bond, patients daughter and power of attorney. Daughter said she does not want to proceed with the blood transfusion, because her father "is done as he is 87 years old and suffering. The last 3 weeks he has gone downhill." RN notified J. Olena Heckle, NP. RN told Juanda Bond, power of attorney to bring a copy of the forms when she arrives at the hospital today. Patient refusing blood transfusion and daughter, POA refusing blood transfusion. Both informed of the risks and benefits of this decision.

## 2020-10-04 DIAGNOSIS — Z515 Encounter for palliative care: Secondary | ICD-10-CM

## 2020-10-04 DIAGNOSIS — C7951 Secondary malignant neoplasm of bone: Secondary | ICD-10-CM | POA: Diagnosis not present

## 2020-10-04 DIAGNOSIS — C61 Malignant neoplasm of prostate: Secondary | ICD-10-CM | POA: Diagnosis not present

## 2020-10-04 LAB — CBC
HCT: 30.5 % — ABNORMAL LOW (ref 39.0–52.0)
Hemoglobin: 10.2 g/dL — ABNORMAL LOW (ref 13.0–17.0)
MCH: 31 pg (ref 26.0–34.0)
MCHC: 33.4 g/dL (ref 30.0–36.0)
MCV: 92.7 fL (ref 80.0–100.0)
Platelets: 68 10*3/uL — ABNORMAL LOW (ref 150–400)
RBC: 3.29 MIL/uL — ABNORMAL LOW (ref 4.22–5.81)
RDW: 15.3 % (ref 11.5–15.5)
WBC: 4.7 10*3/uL (ref 4.0–10.5)
nRBC: 5.1 % — ABNORMAL HIGH (ref 0.0–0.2)

## 2020-10-04 LAB — TYPE AND SCREEN
ABO/RH(D): A NEG
Antibody Screen: NEGATIVE
Unit division: 0
Unit division: 0

## 2020-10-04 LAB — BPAM RBC
Blood Product Expiration Date: 202208062359
Blood Product Expiration Date: 202208082359
ISSUE DATE / TIME: 202207211203
ISSUE DATE / TIME: 202207212120
Unit Type and Rh: 600
Unit Type and Rh: 600

## 2020-10-04 LAB — BASIC METABOLIC PANEL
Anion gap: 12 (ref 5–15)
BUN: 63 mg/dL — ABNORMAL HIGH (ref 8–23)
CO2: 19 mmol/L — ABNORMAL LOW (ref 22–32)
Calcium: 8.6 mg/dL — ABNORMAL LOW (ref 8.9–10.3)
Chloride: 121 mmol/L — ABNORMAL HIGH (ref 98–111)
Creatinine, Ser: 1.83 mg/dL — ABNORMAL HIGH (ref 0.61–1.24)
GFR, Estimated: 34 mL/min — ABNORMAL LOW (ref >60)
Glucose, Bld: 266 mg/dL — ABNORMAL HIGH (ref 70–99)
Potassium: 4.2 mmol/L (ref 3.5–5.1)
Sodium: 152 mmol/L — ABNORMAL HIGH (ref 135–145)

## 2020-10-04 LAB — GLUCOSE, CAPILLARY: Glucose-Capillary: 242 mg/dL — ABNORMAL HIGH (ref 70–99)

## 2020-10-04 MED ORDER — LATANOPROST 0.005 % OP SOLN: 1.0000 [drp] | Freq: Every day | OPHTHALMIC | 12 refills | Status: AC

## 2020-10-04 MED ORDER — TRAZODONE HCL 50 MG PO TABS: 25.0000 mg | ORAL_TABLET | Freq: Every evening | ORAL | Status: AC | PRN

## 2020-10-04 MED ORDER — ONDANSETRON HCL 4 MG PO TABS: 4.0000 mg | ORAL_TABLET | Freq: Four times a day (QID) | ORAL | 0 refills | Status: AC | PRN

## 2020-10-04 MED ORDER — MORPHINE SULFATE (PF) 2 MG/ML IV SOLN: 1.0000 mg | Freq: Four times a day (QID) | INTRAVENOUS | 0 refills | Status: AC | PRN

## 2020-10-04 NOTE — Progress Notes (Signed)
Pt alert and oriented with some periods of confusion. Oriented to room and callbell with no complications. This shift PRN zofran x1. No episodes of vomiting.allevyn to sacrum and scrotal area for skin tear.SCD's and condom cath in place. Scattered bruising. Tolerated 1 unit PRBC. Tele box 61.

## 2020-10-04 NOTE — Progress Notes (Signed)
Pt being d/c to [facility] via [place]. RN called report to [employer] and spoke with RN. Pt being d/c with condom cath in place and 2 PIV in place per request of [employer]. Discharge paperwork placed in packet.

## 2020-10-04 NOTE — TOC Transition Note (Signed)
Transition of Care [facility]) - CM/SW Discharge Note   Patient Details  Name: Ian Patterson MRN: [id number] Date of Birth: Dec 06, 1923  Transition of Care [facility]) CM/SW Contact:  Trish Mage, LCSW Phone Number: 10/04/2020, 10:46 AM   Clinical Narrative:   Patient who is stable for d/c will transfer to [facility] today.  [facility] arranged.  Nursing, please call report to [phone]. TOC sign off.    Final next level of care: [place] Barriers to Discharge: Barriers Resolved   Patient Goals and CMS Choice        Discharge Placement                       Discharge Plan and Services                                     Social Determinants of Health (SDOH) Interventions     Readmission Risk Interventions No flowsheet data found.

## 2020-10-04 NOTE — Discharge Summary (Signed)
[facility]  Physician Discharge Summary   Patient ID: Ian Patterson MRN: [id number] DOB/AGE: 1924-02-25 87 y.o.  Admit date: 10/02/2020 Discharge date:   10/04/20  PCP: Everrett Coombe, MD  DISCHARGE DIAGNOSES:  Metastatic prostate cancer Cancer associated pain Severe symptomatic anemia status post blood transfusion Persistent nausea Thrombocytopenia Acute kidney injury Diabetes mellitus type 2, uncontrolled with hyperglycemia  RECOMMENDATIONS FOR OUTPATIENT FOLLOW UP: Patient being discharged to residential hospice  CODE STATUS: DNR  DISCHARGE CONDITION: poor  Diet recommendation: Dysphagia 3 diet with thin liquids.  Aspiration precautions  INITIAL HISTORY:  87 y.o. male with a known history of prostate cancer with mets to bone under hospice care at home, asthma, diabetes hyperlipidemia presented to the emergency department for evaluation of bone pain.  Patient reportedly lives alone in a condo with hospice and home care checking on him regularly.  He was independent with his activities of daily living sober for about 3 days prior to this admission pain has worsened and has limited his activities.  Patient found to be anemic and dehydrated.  Hospitalized for further management.      HOSPITAL COURSE:   Nausea Suspect that this is secondary to his malignancy to some extent.  Symptomatic treatment.  Has not had any vomiting.  Since abdominal examination was benign no imaging studies were pursued.     Severe symptomatic anemia Patient was transfused 2 units of blood for hemoglobin of 5.5.  No overt bleeding was noted   Metastatic prostate cancer/cancer associated pain He was under hospice services at home.  Discussed with daughter.  Prognosis appears to be poor.  Seen by hospice in the hospital.  Plan was to transition to residential hospice which will occur today.  Patient has bony metastases.  His alkaline phosphatase is noted to be significantly elevated.      Thrombocytopenia Most likely secondary to malignancy.  Acute kidney injury Thought to be due to dehydration from poor oral intake.  Was given IV fluids with some improvement noted in creatinine.   Prognosis thought to be poor due to his metastatic prostate cancer which could also be contributing to his renal failure.  Does not appear to be retaining any urine however.  Diabetes mellitus type 2, uncontrolled with hyperglycemia Given insulin in the hospital.  May resume glipizide.  Monitor CBGs as indicated.  GERD  Patient is stable for transfer to residential hospice.  PERTINENT LABS:  The results of significant diagnostics from this hospitalization (including imaging, microbiology, ancillary and laboratory) are listed below for reference.    Microbiology: Recent Results (from the past 240 hour(s))  SARS CORONAVIRUS 2 (TAT 6-24 HRS) Nasopharyngeal Nasopharyngeal Swab     Status: None   Collection Time: 10/02/20  6:32 PM   Specimen: Nasopharyngeal Swab  Result Value Ref Range Status   SARS Coronavirus 2 NEGATIVE NEGATIVE Final    Comment: (NOTE) SARS-CoV-2 target nucleic acids are NOT DETECTED.  The SARS-CoV-2 RNA is generally detectable in upper and lower respiratory specimens during the acute phase of infection. Negative results do not preclude SARS-CoV-2 infection, do not rule out co-infections with other pathogens, and should not be used as the sole basis for treatment or other patient management decisions. Negative results must be combined with clinical observations, patient history, and epidemiological information. The expected result is Negative.  Fact Sheet for Patients: SugarRoll.be  Fact Sheet for Healthcare Providers: https://www.woods-mathews.com/  This test is not yet approved or cleared by the [place] FDA and  has been  authorized for detection and/or diagnosis of SARS-CoV-2 by FDA under an Emergency Use  Authorization (EUA). This EUA will remain  in effect (meaning this test can be used) for the duration of the COVID-19 declaration under Se ction 564(b)(1) of the Act, 21 U.S.C. section 360bbb-3(b)(1), unless the authorization is terminated or revoked sooner.  Performed at [facility], [place] [address], [place], [place] [address]      Labs:  COVID-19 Labs  Recent Labs    10/02/20 2214  FERRITIN 3,070*    Lab Results  Component Value Date   [place] NEGATIVE 10/02/2020      Basic Metabolic Panel: Recent Labs  Lab 10/02/20 1818 10/02/20 2214 10/03/20 0303 10/04/20 0451  NA 142  --  148* 152*  K 4.3  --  3.9 4.2  CL 111  --  118* 121*  CO2 19*  --  23 19*  GLUCOSE 553*  --  349* 266*  BUN 90*  --  77* 63*  CREATININE 2.64* 2.39* 2.09* 1.83*  CALCIUM 8.7*  --  8.7* 8.6*   Liver Function Tests: Recent Labs  Lab 10/02/20 1818 10/03/20 0303  AST 24 27  ALT 13 12  ALKPHOS 4,565* 3,915*  BILITOT 1.0 0.6  PROT 6.2* 5.2*  ALBUMIN 2.7* 2.4*   Recent Labs  Lab 10/02/20 1818  LIPASE 25    CBC: Recent Labs  Lab 10/02/20 1818 10/02/20 2214 10/03/20 0303 10/04/20 0451  WBC 4.4 3.8* 2.6* 4.7  NEUTROABS 3.3  --   --   --   HGB 7.4* 7.1* 5.5* 10.2*  HCT 22.3* 21.9* 17.6* 30.5*  MCV 95.7 95.2 97.8 92.7  PLT 88* 84* 66* 68*    CBG: Recent Labs  Lab 10/03/20 1147 10/03/20 1547 10/03/20 1749 10/03/20 2010 10/04/20 0817  GLUCAP 333* 281* 250* 252* 242*     IMAGING STUDIES DG Chest Port 1 View  Result Date: 10/02/2020 CLINICAL DATA:  Weakness and shortness of breath. Nausea and vomiting for 3 days. Technologist notes state hospice patient. EXAM: PORTABLE CHEST 1 VIEW COMPARISON:  Remote radiograph and CT from 2012 FINDINGS: Patient's chin obscures the apices.The cardiomediastinal contours are normal. Atherosclerosis of the thoracic aorta. Pulmonary vasculature is normal. No consolidation, pleural effusion, or pneumothorax. Advanced degenerative  change of the left shoulder. IMPRESSION: No acute chest finding. Electronically Signed   By: Keith Rake M.D.   On: 10/02/2020 18:48    DISCHARGE EXAMINATION: Vitals:   10/03/20 2134 10/03/20 2204 10/04/20 0147 10/04/20 0613  BP: (!) 153/60 (!) 140/53 (!) 158/62 (!) 146/68  Pulse: 93 88 83 79  Resp: '20  18 20  '$ Temp: 99.7 F (37.6 C) 99.3 F (37.4 C) 98 F (36.7 C) 97.7 F (36.5 C)  TempSrc: Oral Oral Oral Oral  SpO2: 100% 100% 99% 100%  Weight:      Height:       General appearance: Awake alert.  In no distress Resp: Clear to auscultation bilaterally.  Normal effort Cardio: S1-S2 is normal regular.  No S3-S4.  No rubs murmurs or bruit GI: Abdomen is soft.  Nontender nondistended.  Bowel sounds are present normal.  No masses organomegaly    DISPOSITION: Residential hospice     Allergies as of 10/04/2020       Reactions   Penicillins         Medication List     TAKE these medications    gabapentin 100 MG capsule Commonly known as: NEURONTIN Take 100 mg by  mouth 3 (three) times daily.   glipiZIDE 10 MG 24 hr tablet Commonly known as: GLUCOTROL XL Take 10 mg by mouth daily with breakfast.   latanoprost 0.005 % ophthalmic solution Commonly known as: XALATAN Place 1 drop into both eyes at bedtime.   morphine 2 MG/ML injection Inject 0.5 mLs (1 mg total) into the vein every 6 (six) hours as needed.   ondansetron 4 MG tablet Commonly known as: ZOFRAN Take 1 tablet (4 mg total) by mouth every 6 (six) hours as needed for nausea.   prochlorperazine 10 MG tablet Commonly known as: COMPAZINE Take 10 mg by mouth 3 (three) times daily.   terazosin 5 MG capsule Commonly known as: HYTRIN Take 5-10 mg by mouth See admin instructions. Takes 2 tablets in the morning and 1 tablet in the evening   traMADol 50 MG tablet Commonly known as: ULTRAM Take 50 mg by mouth every 4 (four) hours as needed for severe pain. Pain   traZODone 50 MG tablet Commonly known as:  DESYREL Take 0.5 tablets (25 mg total) by mouth at bedtime as needed for sleep.           TOTAL DISCHARGE TIME: 35 minutes  Jonda Alanis [employer] on [email or web address]  10/04/2020, 10:03 AM

## 2020-10-04 NOTE — Progress Notes (Signed)
Manufacturing engineer [facility]) Hospital Liaison note.     This patient is approved to transfer to [facility] today.   Please arrange transport with  [facility] as they contract with [facility] for our hospice patients.  RN please call report to [phone].   Thank you,     Farrel Gordon, RN, [facility] Liaison  [phone]

## 2020-10-14 DEATH — deceased
